# Patient Record
Sex: Female | Born: 1935 | Race: White | Hispanic: No | Smoking: Never smoker
Health system: Southern US, Community
[De-identification: ages and names within clinical notes are randomized; demographics above are authoritative.]

## PROBLEM LIST (undated history)

## (undated) DIAGNOSIS — I272 Pulmonary hypertension, unspecified: Secondary | ICD-10-CM

## (undated) DIAGNOSIS — J309 Allergic rhinitis, unspecified: Secondary | ICD-10-CM

## (undated) DIAGNOSIS — H9190 Unspecified hearing loss, unspecified ear: Secondary | ICD-10-CM

## (undated) DIAGNOSIS — I340 Nonrheumatic mitral (valve) insufficiency: Secondary | ICD-10-CM

## (undated) DIAGNOSIS — C4491 Basal cell carcinoma of skin, unspecified: Secondary | ICD-10-CM

## (undated) DIAGNOSIS — E559 Vitamin D deficiency, unspecified: Secondary | ICD-10-CM

## (undated) DIAGNOSIS — L409 Psoriasis, unspecified: Secondary | ICD-10-CM

## (undated) DIAGNOSIS — I519 Heart disease, unspecified: Secondary | ICD-10-CM

## (undated) DIAGNOSIS — R519 Headache, unspecified: Secondary | ICD-10-CM

## (undated) DIAGNOSIS — E785 Hyperlipidemia, unspecified: Secondary | ICD-10-CM

## (undated) DIAGNOSIS — R51 Headache: Secondary | ICD-10-CM

## (undated) DIAGNOSIS — M810 Age-related osteoporosis without current pathological fracture: Secondary | ICD-10-CM

## (undated) DIAGNOSIS — E039 Hypothyroidism, unspecified: Secondary | ICD-10-CM

## (undated) DIAGNOSIS — I441 Atrioventricular block, second degree: Secondary | ICD-10-CM

## (undated) DIAGNOSIS — G8929 Other chronic pain: Secondary | ICD-10-CM

## (undated) DIAGNOSIS — H3500 Unspecified background retinopathy: Secondary | ICD-10-CM

## (undated) DIAGNOSIS — M1990 Unspecified osteoarthritis, unspecified site: Secondary | ICD-10-CM

## (undated) DIAGNOSIS — I351 Nonrheumatic aortic (valve) insufficiency: Secondary | ICD-10-CM

## (undated) DIAGNOSIS — I10 Essential (primary) hypertension: Secondary | ICD-10-CM

## (undated) DIAGNOSIS — K219 Gastro-esophageal reflux disease without esophagitis: Secondary | ICD-10-CM

## (undated) DIAGNOSIS — I5189 Other ill-defined heart diseases: Secondary | ICD-10-CM

## (undated) HISTORY — DX: Pulmonary hypertension, unspecified: I27.20

## (undated) HISTORY — PX: PACEMAKER PLACEMENT: SHX43

## (undated) HISTORY — DX: Other chronic pain: G89.29

## (undated) HISTORY — DX: Atrioventricular block, second degree: I44.1

## (undated) HISTORY — DX: Unspecified osteoarthritis, unspecified site: M19.90

## (undated) HISTORY — DX: Allergic rhinitis, unspecified: J30.9

## (undated) HISTORY — DX: Basal cell carcinoma of skin, unspecified: C44.91

## (undated) HISTORY — DX: Hypercalcemia: E83.52

## (undated) HISTORY — DX: Headache, unspecified: R51.9

## (undated) HISTORY — PX: JOINT REPLACEMENT: SHX530

## (undated) HISTORY — DX: Nonrheumatic aortic (valve) insufficiency: I35.1

## (undated) HISTORY — DX: Hypothyroidism, unspecified: E03.9

## (undated) HISTORY — DX: Other ill-defined heart diseases: I51.89

## (undated) HISTORY — DX: Psoriasis, unspecified: L40.9

## (undated) HISTORY — DX: Hyperlipidemia, unspecified: E78.5

## (undated) HISTORY — DX: Nonrheumatic mitral (valve) insufficiency: I34.0

## (undated) HISTORY — DX: Vitamin D deficiency, unspecified: E55.9

## (undated) HISTORY — DX: Headache: R51

## (undated) HISTORY — DX: Gastro-esophageal reflux disease without esophagitis: K21.9

## (undated) HISTORY — DX: Unspecified background retinopathy: H35.00

---

## 2000-02-14 ENCOUNTER — Encounter: Admission: RE | Admit: 2000-02-14 | Discharge: 2000-02-14 | Payer: Self-pay | Admitting: Family Medicine

## 2000-02-14 ENCOUNTER — Encounter: Payer: Self-pay | Admitting: Family Medicine

## 2000-06-20 ENCOUNTER — Encounter: Admission: RE | Admit: 2000-06-20 | Discharge: 2000-06-20 | Payer: Self-pay | Admitting: Obstetrics and Gynecology

## 2000-06-20 ENCOUNTER — Encounter: Payer: Self-pay | Admitting: Obstetrics and Gynecology

## 2000-06-26 ENCOUNTER — Encounter: Admission: RE | Admit: 2000-06-26 | Discharge: 2000-06-26 | Payer: Self-pay | Admitting: Obstetrics and Gynecology

## 2000-06-26 ENCOUNTER — Encounter: Payer: Self-pay | Admitting: Obstetrics and Gynecology

## 2001-11-29 ENCOUNTER — Encounter: Admission: RE | Admit: 2001-11-29 | Discharge: 2001-11-29 | Payer: Self-pay | Admitting: Obstetrics and Gynecology

## 2001-11-29 ENCOUNTER — Encounter: Payer: Self-pay | Admitting: Obstetrics and Gynecology

## 2001-12-09 ENCOUNTER — Encounter: Admission: RE | Admit: 2001-12-09 | Discharge: 2001-12-09 | Payer: Self-pay | Admitting: *Deleted

## 2001-12-09 ENCOUNTER — Encounter: Payer: Self-pay | Admitting: *Deleted

## 2001-12-09 ENCOUNTER — Encounter (INDEPENDENT_AMBULATORY_CARE_PROVIDER_SITE_OTHER): Payer: Self-pay | Admitting: Specialist

## 2003-01-29 ENCOUNTER — Encounter: Admission: RE | Admit: 2003-01-29 | Discharge: 2003-01-29 | Payer: Self-pay | Admitting: Obstetrics and Gynecology

## 2003-01-29 ENCOUNTER — Encounter: Payer: Self-pay | Admitting: Obstetrics and Gynecology

## 2004-06-10 ENCOUNTER — Encounter: Admission: RE | Admit: 2004-06-10 | Discharge: 2004-06-10 | Payer: Self-pay | Admitting: Family Medicine

## 2005-01-19 ENCOUNTER — Encounter: Admission: RE | Admit: 2005-01-19 | Discharge: 2005-01-19 | Payer: Self-pay | Admitting: Family Medicine

## 2005-09-21 ENCOUNTER — Encounter: Admission: RE | Admit: 2005-09-21 | Discharge: 2005-09-21 | Payer: Self-pay | Admitting: Family Medicine

## 2006-02-23 ENCOUNTER — Encounter: Admission: RE | Admit: 2006-02-23 | Discharge: 2006-02-23 | Payer: Self-pay | Admitting: Obstetrics and Gynecology

## 2006-05-24 ENCOUNTER — Encounter: Admission: RE | Admit: 2006-05-24 | Discharge: 2006-06-06 | Payer: Self-pay | Admitting: Orthopedic Surgery

## 2006-05-31 ENCOUNTER — Inpatient Hospital Stay (HOSPITAL_COMMUNITY): Admission: RE | Admit: 2006-05-31 | Discharge: 2006-06-06 | Payer: Self-pay | Admitting: Orthopedic Surgery

## 2006-06-05 ENCOUNTER — Encounter: Payer: Self-pay | Admitting: Vascular Surgery

## 2006-06-13 ENCOUNTER — Encounter: Admission: RE | Admit: 2006-06-13 | Discharge: 2006-06-13 | Payer: Self-pay | Admitting: Orthopedic Surgery

## 2007-02-25 ENCOUNTER — Encounter: Admission: RE | Admit: 2007-02-25 | Discharge: 2007-02-25 | Payer: Self-pay | Admitting: Family Medicine

## 2007-04-29 ENCOUNTER — Encounter: Admission: RE | Admit: 2007-04-29 | Discharge: 2007-04-29 | Payer: Self-pay | Admitting: Family Medicine

## 2007-06-03 ENCOUNTER — Encounter: Admission: RE | Admit: 2007-06-03 | Discharge: 2007-06-03 | Payer: Self-pay | Admitting: Gastroenterology

## 2007-06-08 ENCOUNTER — Encounter: Admission: RE | Admit: 2007-06-08 | Discharge: 2007-06-08 | Payer: Self-pay | Admitting: Orthopedic Surgery

## 2008-02-28 ENCOUNTER — Encounter: Admission: RE | Admit: 2008-02-28 | Discharge: 2008-02-28 | Payer: Self-pay | Admitting: Family Medicine

## 2008-03-03 ENCOUNTER — Encounter: Admission: RE | Admit: 2008-03-03 | Discharge: 2008-03-03 | Payer: Self-pay | Admitting: Cardiology

## 2008-03-05 ENCOUNTER — Ambulatory Visit (HOSPITAL_COMMUNITY): Admission: RE | Admit: 2008-03-05 | Discharge: 2008-03-06 | Payer: Self-pay | Admitting: Cardiology

## 2008-03-23 ENCOUNTER — Encounter: Admission: RE | Admit: 2008-03-23 | Discharge: 2008-03-23 | Payer: Self-pay | Admitting: Family Medicine

## 2008-04-09 ENCOUNTER — Emergency Department (HOSPITAL_COMMUNITY): Admission: EM | Admit: 2008-04-09 | Discharge: 2008-04-09 | Payer: Self-pay | Admitting: Emergency Medicine

## 2008-04-15 ENCOUNTER — Encounter: Admission: RE | Admit: 2008-04-15 | Discharge: 2008-04-15 | Payer: Self-pay | Admitting: Family Medicine

## 2008-05-05 ENCOUNTER — Encounter: Admission: RE | Admit: 2008-05-05 | Discharge: 2008-05-05 | Payer: Self-pay | Admitting: Family Medicine

## 2008-10-07 ENCOUNTER — Encounter: Admission: RE | Admit: 2008-10-07 | Discharge: 2008-10-07 | Payer: Self-pay | Admitting: Family Medicine

## 2009-03-01 ENCOUNTER — Encounter: Admission: RE | Admit: 2009-03-01 | Discharge: 2009-03-01 | Payer: Self-pay | Admitting: Family Medicine

## 2009-06-14 IMAGING — CR DG CHEST 2V
2 series · 2 of 2 positions shown · non-contrast
Comparison: 05/24/2006

CLINICAL DATA: Dyspnea.  Chest pain.  Preop for pacer placement.

CHEST - 2 VIEW

[w chest pa]
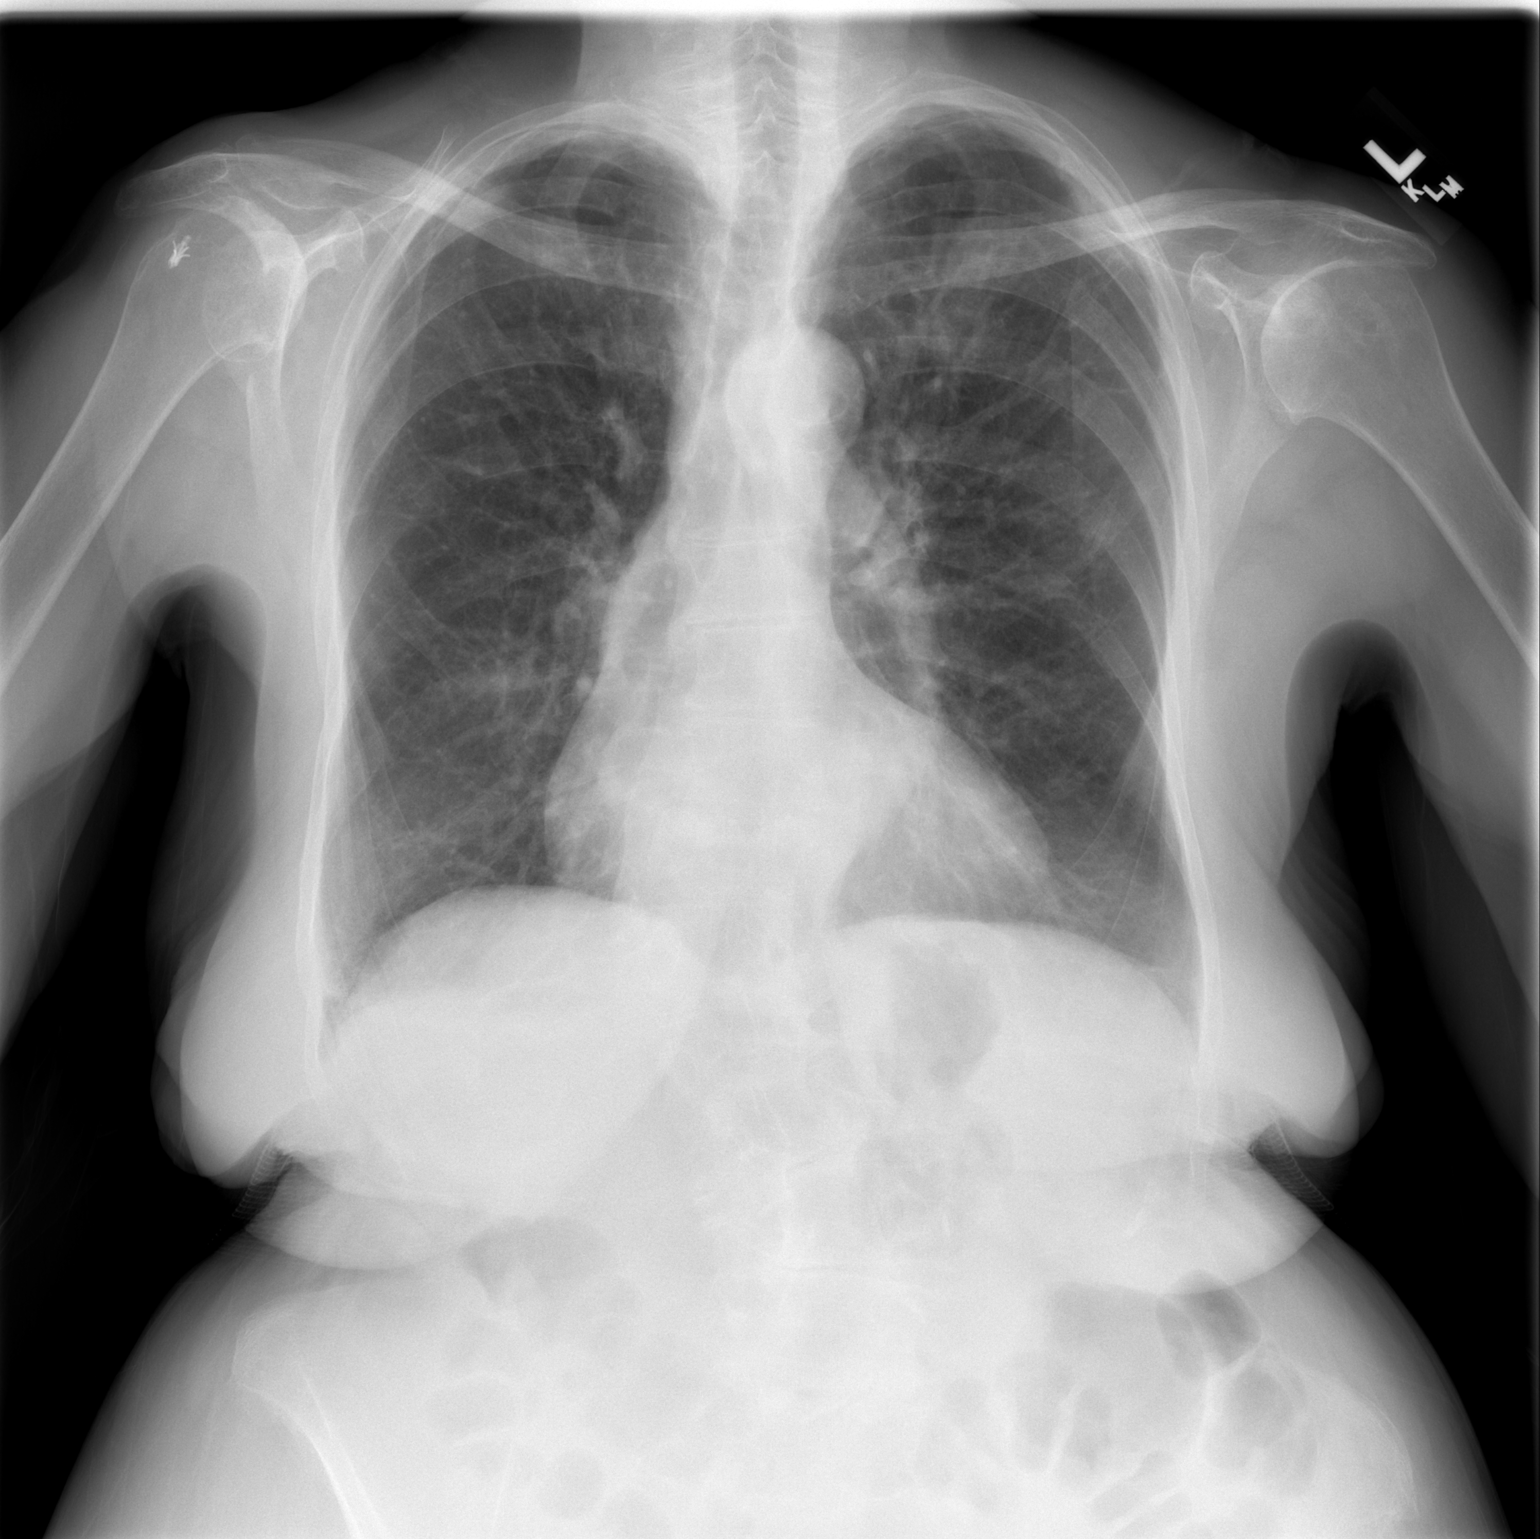

[w chest lat]
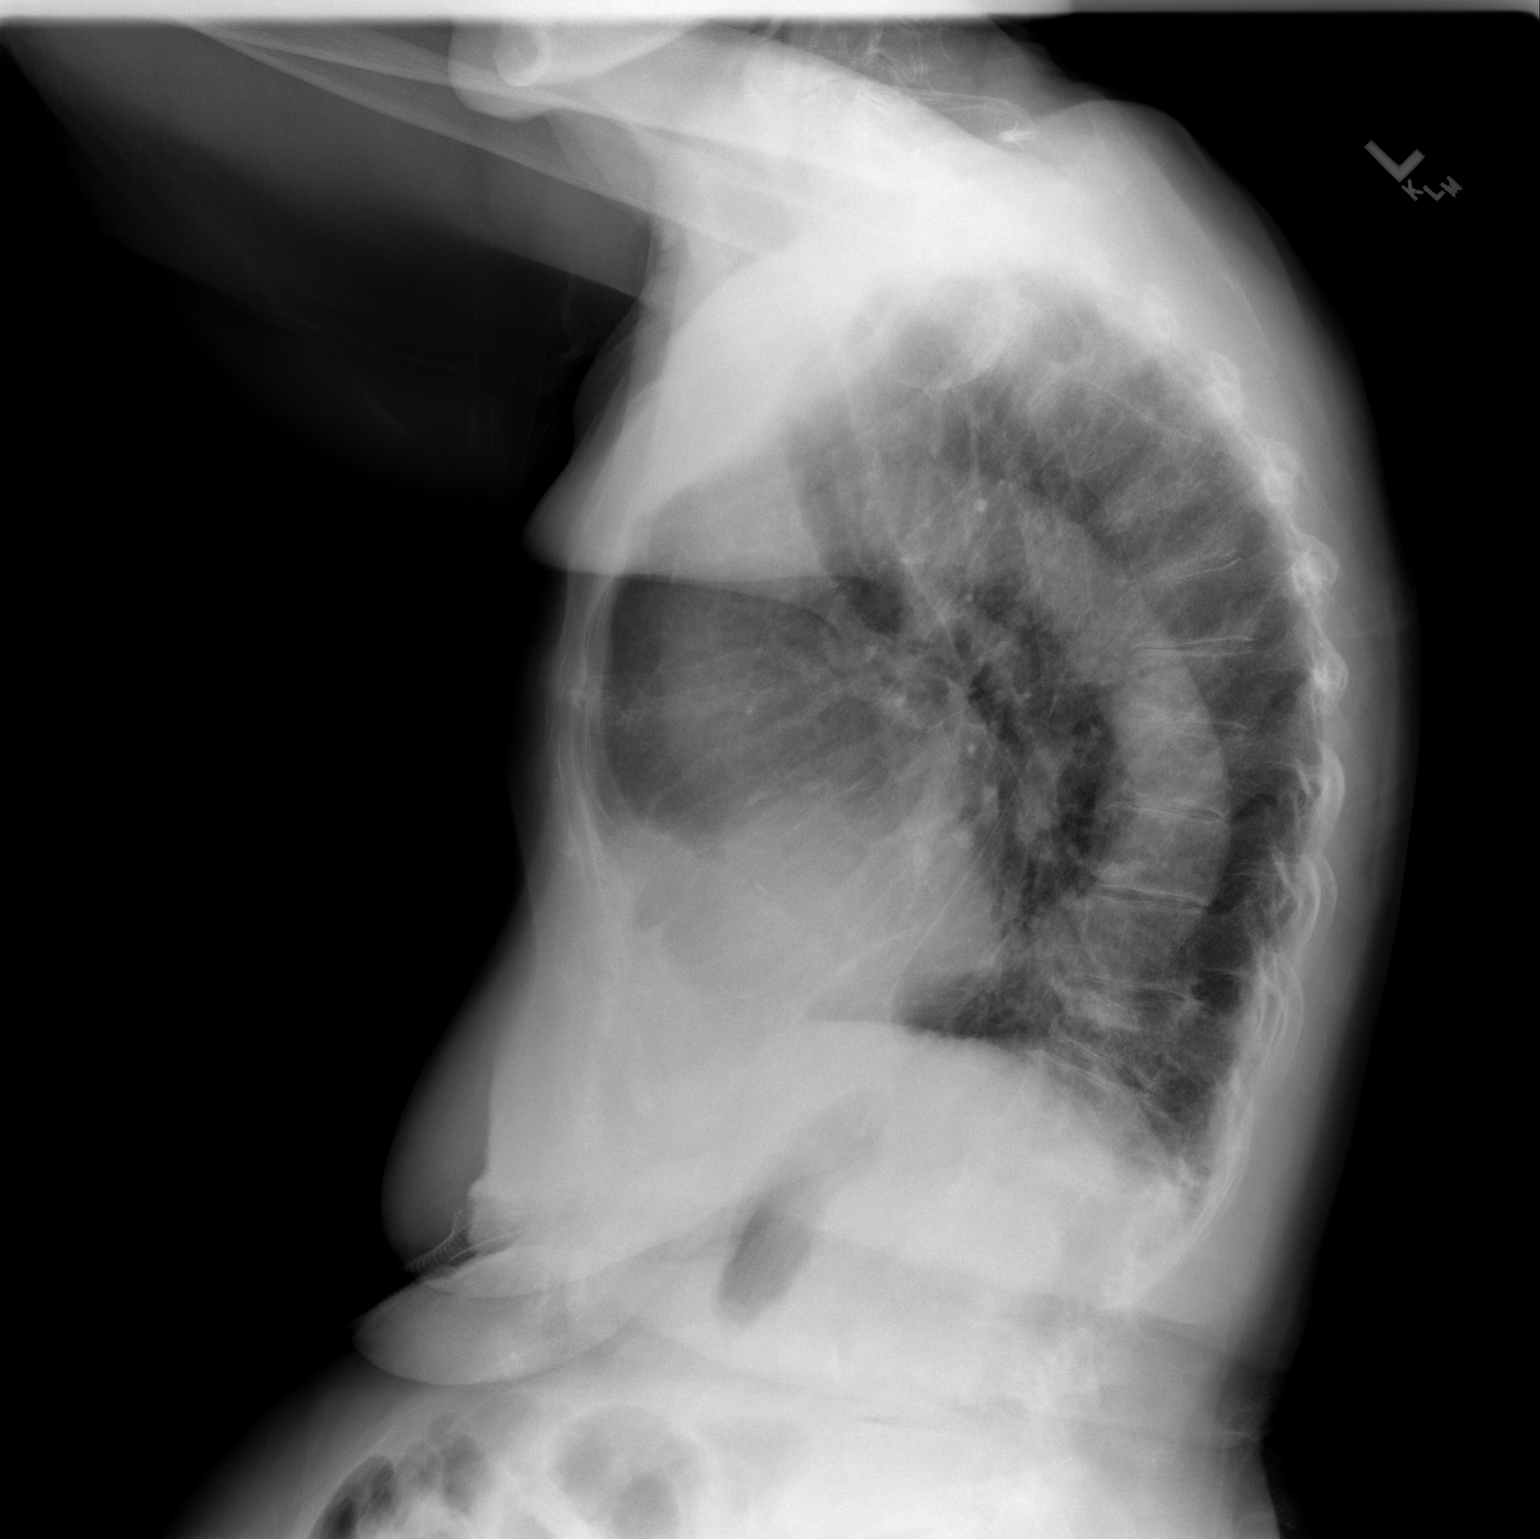

[2 of 2 positions shown; findings below may reference images not displayed]

FINDINGS: Moderate osteopenia.  Suspect hyperinflation.  A mild
lower thoracic compression deformity without significant canal
compromise.  This is of indeterminate acuity as this area is poorly
evaluated on the prior plain film.  Right rotator cuff repair.
Midline trachea. Normal heart size.  Tortuous descending thoracic
aorta. No pleural effusion or pneumothorax. Biapical pleural
thickening.  The right lung is clear.  A vague area of increased
density projects over the left clavicle.
IMPRESSION: 1.  No definite acute cardiopulmonary disease.
2.  Vague increased density projecting over the left clavicle.
This may represent a summation shadow.  If the patient has
infectious symptoms, recommend antibiotic therapy and follow-up
plain films in 3 - 4 weeks.  If there are no infectious symptoms,
consider further evaluation with follow-up plain films or chest CT.

## 2009-06-17 IMAGING — CR DG CHEST 2V
2 series · 2 of 2 positions shown · non-contrast
Comparison: 03/03/2008.  CT thoracic spine 04/29/2007.

CLINICAL DATA: 78-year-old female status post pacemaker placement.

CHEST - 2 VIEW

[w chest pa]
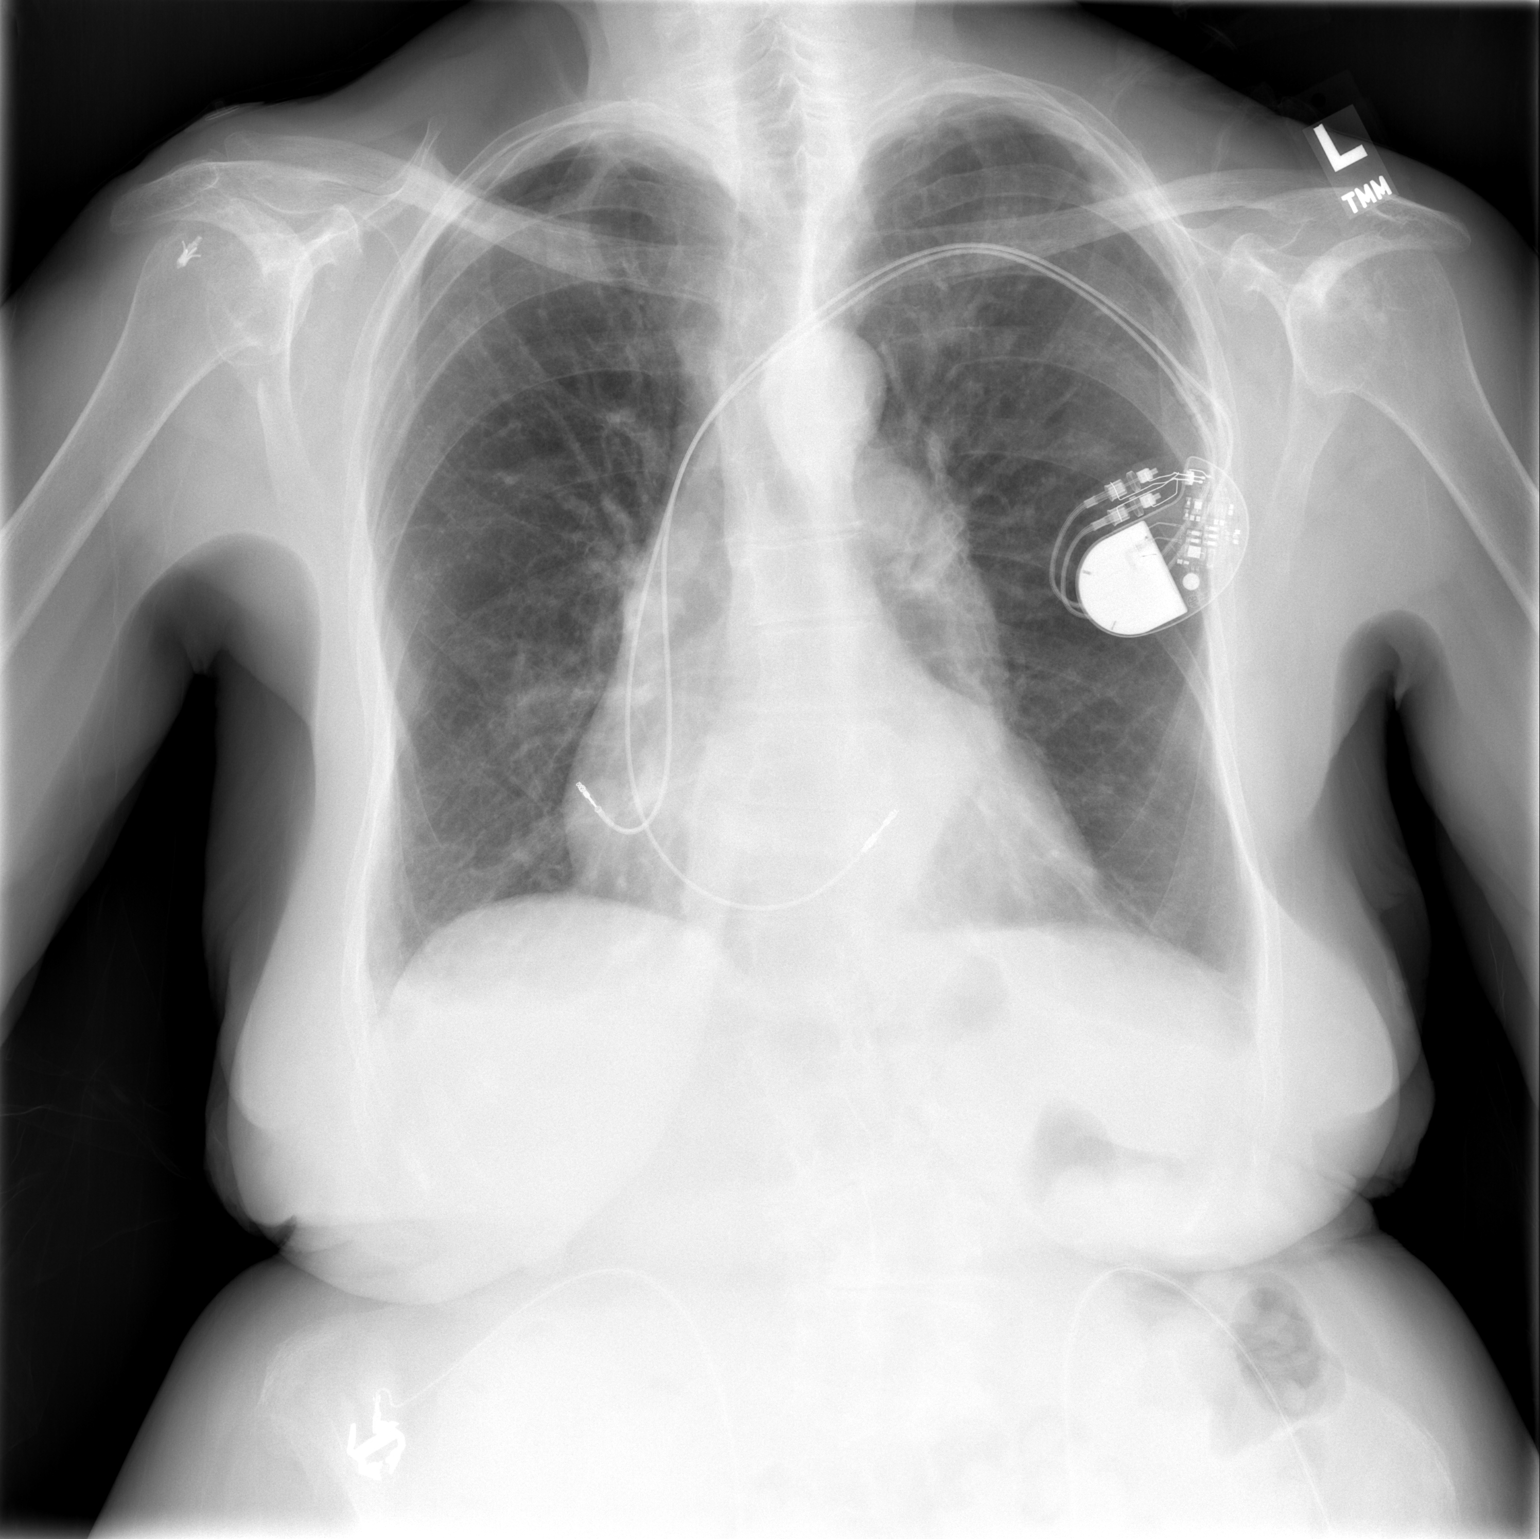

[w chest lat]
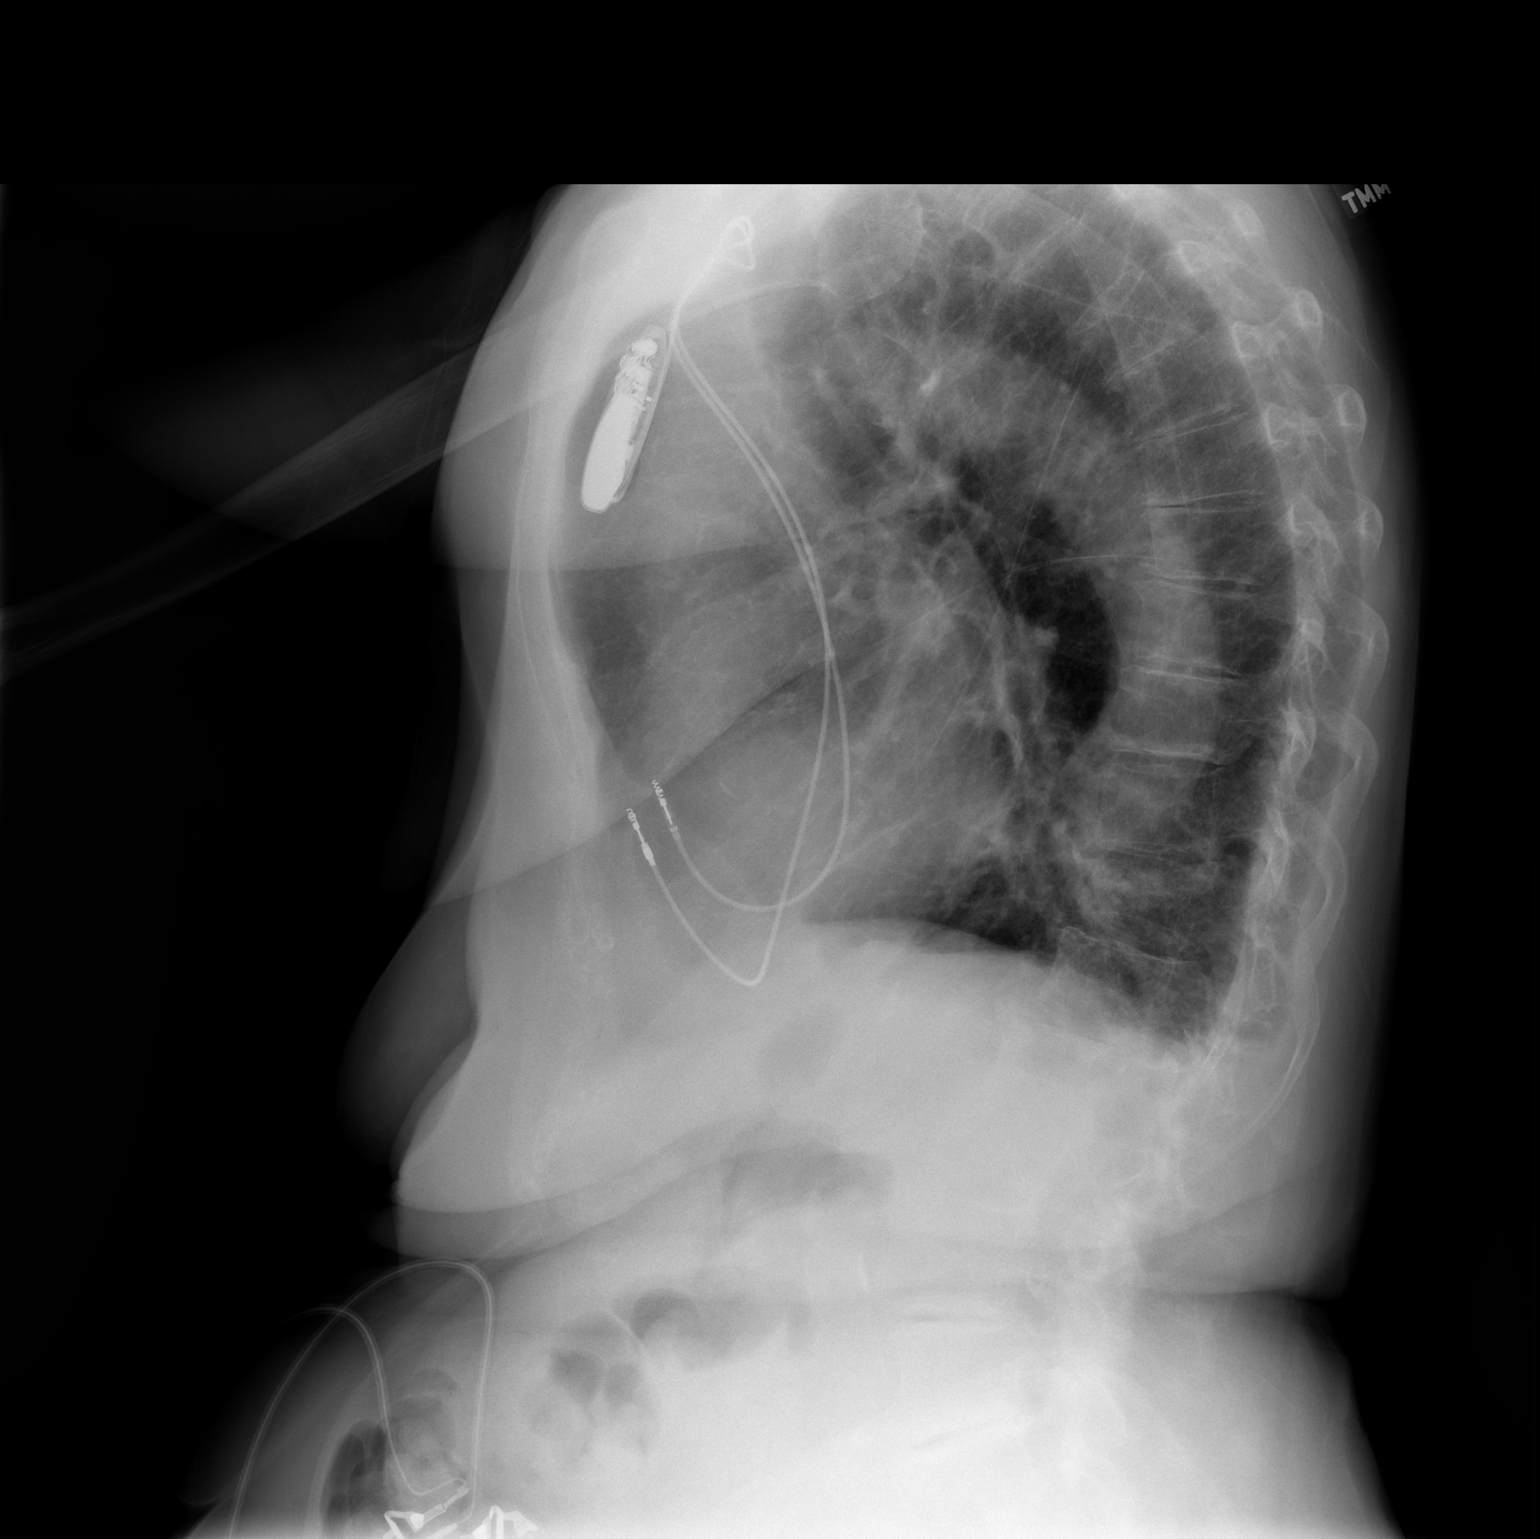

[2 of 2 positions shown; findings below may reference images not displayed]

FINDINGS: New left chest dual lead transvenous pacemaker in place.
Leads course to the right atrium and right ventricle.  No
pneumothorax.  Stable cardiac size mediastinal contour with very
tortuous descending thoracic aorta.  Stable pulmonary ventilation
with biapical scarring.  No pulmonary edema.  Stable lower thoracic
vertebral body compression fracture and visualized osseous
structures.  Increased lung volumes.

The asymmetric density in the left upper lobe partially projecting
over the clavicle described on the prior exam is decreased in
conspicuity.  Correlation with the prior thoracic spine CT revealed
a pulmonary nodule in the superior segment of the left lower lobe.
IMPRESSION: 1.  New left chest dual lead transvenous pacemaker, no adverse
features.
2.  Stable pulmonary ventilation, no pneumothorax.
3.  Superior segment left lower lobe pulmonary nodule.  Follow-up
chest CT is indicated, preferably with contrast if renal function
allows.

## 2009-11-18 ENCOUNTER — Encounter: Admission: RE | Admit: 2009-11-18 | Discharge: 2009-11-18 | Payer: Self-pay | Admitting: Neurosurgery

## 2010-04-11 ENCOUNTER — Encounter: Admission: RE | Admit: 2010-04-11 | Discharge: 2010-04-11 | Payer: Self-pay | Admitting: Family Medicine

## 2010-10-02 ENCOUNTER — Encounter: Payer: Self-pay | Admitting: Family Medicine

## 2011-01-27 NOTE — Op Note (Signed)
NAME:  Anne Ellis, Anne Ellis                  ACCOUNT NO.:  [id number]   MEDICAL RECORD NO.:  [id number]          PATIENT TYPE:  INP   LOCATION:  2899                         FACILITY:  [facility]   PHYSICIAN:  Thereasa Distance A. Mortenson, M.D.DATE OF BIRTH:  June 13, 1930   DATE OF PROCEDURE:  05/31/2006  DATE OF DISCHARGE:                                 OPERATIVE REPORT   PREOPERATIVE DIAGNOSIS:  End-stage osteoarthritis, right knee.   POSTOPERATIVE DIAGNOSIS:  End-stage osteoarthritis, right knee.   OPERATION:  Total knee replacement on the right, all components glued in  using a standard right femoral cemented ACL femoral component with a DePuy  NBT keeled tibial tray, cemented, size 4, with a 10-mm standard-size  polyethylene insert and a metal-back cemented patella, standard size.   SURGEON:  Lenard Galloway. Chaney Malling, M.D.   ASSISTANT:  Arlys John D. Petrarca, P.A.-C.   ANESTHESIA:  General.   DRAINS:  None.   COMPONENTS:  None.   PROCEDURE:  The patient was placed on the operating table in supine position  with a pneumatic tourniquet about the right upper thigh.  The right lower  extremity was prepped with DuraPrep and draped out in the usual manner.  The  leg was then wrapped out with an Esmarch and the tourniquet was elevated.  An incision was made starting above the patella and carried down to the  tibial tubercle.  The skin edges were retracted.  Bleeders were coagulated.  A long medial parapatellar incision was made and the patella was everted  laterally.  The fat pad was excised.  The knee was flexed and both medial  and lateral menisci were excised, as was the origin of the anterior and  posterior cruciate ligaments in the femoral notch area.  At this point, the  computer arrays were inserted in the standard manner using Schanz pin.  Registration was started to find the mechanical axis.  The femoral head  center was done first.  The tibial mechanical axis was then done.  The  proximal tibial  plateau was defined and the distal femoral condyle and  lateral condyles were defined and entered in the computer following computer  prompting.   Tibial planning following the computer prompting was done.  Tibial resection  settings were registered and tibial proximal resection was done following  the computer prompts in the standard manner.  Femoral implant planning was  registered, and then the femoral resection cutting blocks were used  following computer promptings.  The distal femoral resection was used first,  then the anterior femoral resection, and the final femoral resection guide,  and doing chamfer cuts and drilling holes were done.  Once this was all  accomplished, the tibia was subluxed anteriorly using appropriate  retractors.  The proximal tibia was measured and a size 4 tibial tray seemed  to fit nicely.  This was centered on the cut surface and locked in place  with fixation locks.  The smoke stack was inserted and locked in place, and  then a drill hole was placed in the proximal tibia.  The smoke stack was  then removed and the keel winged punch was inserted and made flush with the  tibial tray.  Once this was accomplished, a 10-mm poly trial was selected  and placed on the tibial trial plate.  The standard femoral component was  inserted over the distal end of the femur and all the components were  articulated.  The knee was then put through a full range of motion and there  were full flexion, full extension and excellent alignment to varus and  valgus.  The tissues were balanced beautifully.  Attention was then turned  to the posterior aspect of the patella.  Using the cutting guide, the  posterior aspect of the patella was resected, and then drill holes were  placed in the cut surface of the patella using the drill hole guide.  The  trial poly was then inserted and the knee articulated once again.  The knee  could be put through a full range of motion.  There were  slight  hyperextension, full flexion, excellent stability to varus and valgus  stress.  No spin of the poly.  All soft tissues were balanced beautifully.  The patella would not sublux laterally, even without the capsule closed.  All the components were then removed.  Pulsating lavage was used until all  debris was removed very carefully.  At this point, the glue was mixed, and  each of the components were inserted.  First, the tibial tray was inserted  and glued in in the standard manner.  Glue was placed on the posterior  aspect of the femoral component and the anterior aspect of the resected  distal femur, and the femoral component was driven home.  The trial poly had  been inserted on the tibial tray. The patella had glue placed on the back,  and this was articulated with the cut surface of the patella and held in  place with a patellar clamp.  The knee was held in extension.  Excess glue  was removed in the standard manner before it hardened up.  The glue was  allowed to cure.  The knee was flexed.  The trial poly was removed and an  osteotome was used to remove excess glue.  At this point, the pulsating  lavage was used once again to remove all debris.  The tourniquet was dropped  and bleeders were coagulated.  No arterial bleeders were seen.  The wound  was really quite dry.  The pulsating lavage was used once again and all  debris was meticulously removed.  When this was accomplished to my  satisfaction, the final poly was inserted and cemented in place.  The knee  was put through a full range of motion.  I was extremely pleased with the  final outcome.  The long medial parapatellar incision was closed with  interrupted sutures.  Vicryl was used to close the subcutaneous tissue.  Stainless steel staples were used to close the skin.  Sterile dressings were  applied and the patient returned to the recovery room in excellent condition.  This procedure went extremely well.            ______________________________  Lenard Galloway. Chaney Malling, M.D.     RAM/MEDQ  D:  05/31/2006  T:  05/31/2006  Job:  [id number]

## 2011-01-27 NOTE — Discharge Summary (Signed)
NAME:  Anne Ellis, Anne Ellis                  ACCOUNT NO.:  [id number]   MEDICAL RECORD NO.:  [id number]          PATIENT TYPE:  INP   LOCATION:  5034                         FACILITY:  [facility]   PHYSICIAN:  Rodney A. Mortenson, M.D.DATE OF BIRTH:  03-27-1930   DATE OF ADMISSION:  05/31/2006  DATE OF DISCHARGE:  06/04/2006                                 DISCHARGE SUMMARY   ADMISSION DIAGNOSES:  Osteoarthritis of the right knee.   DISCHARGE DIAGNOSES:  1. Osteoarthritis of the right knee.  2. Hypokalemia.  3. Hyponatremia.  4. Acute blood loss anemia.  5. Hypothyroidism.  6. Hypertension.   PROCEDURE:  Right total knee arthroplasty.   HISTORY:  Anne Ellis is a 77 year old Anne Ellis with right knee pain for  many years.  It has been getting progressively worse.  Anne Ellis has had giving  away as well as waking pain, with intermittent moderate to severe pain in  the knee.  Anne Ellis takes Darvocet for her pain.  Anne Ellis is to the point now where  Anne Ellis is having problems with activities of daily living.  Anne Ellis is now admitted  with radiographic end stage DJD for a total knee arthroplasty.   HOSPITAL COURSE:  A Anne Ellis admitted May 31, 2006  after appropriate laboratory studies were obtained, as well as 1 gram ANCEF  IV on call to the operating room and was taken to the operating room where  Anne Ellis underwent a right total knee arthroplasty by Dr. Chaney Malling and Jacqualine Code PA-C assisting.  Anne Ellis tolerated the procedure well.  Anne Ellis was placed  on a Bayer investigational drug for anti-coagulation.  A Dilaudid PC pump  was used for pain management and in a reduced dose.  Consultations with PT,  OT, and care management were placed.  Ambulation 50% weight bearing on the  right knee.  CPM 0-90 degrees was started for 6-8 hours a day.  Robaxin 500  mg 1 p.o. q.6-8h was ordered for p.r.n. spasms.  Anne Ellis became hyponatremic and  the triamterine/ hydrochlorothiazide was held.  Anne Ellis was placed on a  fluid  restriction of 1200 mL for 24 hours and the hyponatremia had improved.  Anne Ellis  had acute blood loss anemia and was given 1 unit of packed cells on  June 02, 2006 with 10 mg Lasix post transfusion as well as a unit of  packed red cells on June 03, 2006, with 10 mg Lasix post infusion. Anne Ellis  has had resolution of her hemoglobin to over 10 at this time.  Anne Ellis is having  some difficulty with ambulation and is requiring physical and occupational  therapy.  Anne Ellis however has had improvement and is considered for discharge.   LABORATORY DATA:  Admitted with a hemoglobin of 13.1, hematocrit 38.1%,  Wetsel count 4,800, platelets 251,000.  Discharge hemoglobin 10.1, hematocrit  28.9%, Diver count 6,200, platelets 218,000.  Preop pro time 13.2, INR 1.0,  PTT 34.   Preop electrolytes revealed sodium 138, potassium 3.4, chloride 105, CO2 26,  glucose 101, BUN 14, creatinine 0.7, calcium 10.8, total protein 6.7,  albumin 3.9.  AST 37, ALT 25, ALP 102, and total bilirubin 0.7.  Discharge  sodium 134, potassium 3.8, chloride 100, CO2 30, BUN 7, creatinine 0.7, and  glucose was 111.  Urinalysis of September 13 was benign for voided urine.  Anne Ellis was blood type O positive and drug screen negative.  Given 2 units of  packed cells during hospital course.  Urine culture of September 13 revealed  9,000 colonies per mL and insignificant growth.   Radiographic studies:  Chest x-ray of May 25, 2006 revealed no active  cardiopulmonary disease.  Various changes consistent with COPD and emphysema  noted.  EKG of September 13 revealed possible anterior infarct, age  indeterminate.   DISCHARGE INSTRUCTIONS:  Anne Ellis is discharged on a house diet.  No driving or  walking for 6 weeks.  Using her crutches or walker, 3 point gait 50% weight  bearing on the right knee.  CPM 0-90 degrees for 6-8 hours per day.  May  increase this by 5-10 degrees per day as tolerated.  The wound should be  cared for, keeping it  clean and dry and covered with a bandage on a daily  basis.   DISCHARGE MEDICATIONS:  1. Percocet 5/325 1-2 tabs every 4 hours as needed for pain.  2. Robaxin 500 mg 1 p.o. q.6h p.r.n. spasms.  3. Fleets enema or laxative of choice p.r.n.  4. Anne Ellis can resume her Triamterine and hydrochlorothiazide 37.5/25 mg 1      p.o. daily.  Levothyroxine 75 mcg 1 p.o. daily, and calcium 800 mg      daily.   Physical therapy as noted above.   FOLLOW UP:  Anne Ellis needs to return to the office in 2 weeks postop for staple  removal and reevaluation.  Anne Ellis was discharged in improved condition.      Oris Drone Petrarca, P.A.-C.    ______________________________  Lenard Galloway Chaney Malling, M.D.    BDP/MEDQ  D:  06/04/2006  T:  06/04/2006  Job:  [id number]

## 2011-01-27 NOTE — H&P (Signed)
NAME:  Anne Ellis, Anne Ellis                  ACCOUNT NO.:  [id number]   MEDICAL RECORD NO.:  [id number]          PATIENT TYPE:  INP   LOCATION:  NA                           FACILITY:  [facility]   PHYSICIAN:  Rodney A. Mortenson, M.D.DATE OF BIRTH:  1930-09-05   DATE OF ADMISSION:  DATE OF DISCHARGE:                                HISTORY & PHYSICAL   CHIEF COMPLAINT:  Right knee pain.   HISTORY OF PRESENT ILLNESS:  Anne Ellis is a 77 year old [place] female with  right knee pain for 8 years.  Pain has progressed but recently has been  getting progressively worse.  Mechanical __________  were giving way, uses a  cane to ambulate.  Has waking pain.  Pain in the right knee is intermittent,  moderate to severe pain within the right knee.  She takes Darvocet for  severe pain with some relief.  The patient had a concern over a possible  fall secondary to right knee pain.  X-rays of the right knee showed bone on  bone in the mediolateral compartment with large patellofemoral spurs.   ALLERGIES:  THE PATIENT HAS INTOLERANCE TO CODEINE AND MORPHINE, BOTH CAUSE  __________  .  TETRACYCLINE CAUSES A RASH.   PRESENT MEDICATIONS:  1. Levothyroxine 75 mcg 1 tab daily.  2. Triamterine/HCTZ 37.5/25 1 tablet daily.  3. Darvocet N-100 1 tab as needed for pain.  4. Advil 200 mg 1 to tabs as needed for pain once or twice a week.  5. Maximum Greens supplement daily.  6. [employer] Women's Ultra Mega 50+ one daily.  7. Vitamin D 300 I.U. daily.  8. Calcium 800 mg daily.  9. Magnesium 100 mg daily.  10.Potassium 99 mg daily.  11.Vitamin C 300 mg daily.  12.Calcium 1,200 mg daily.  13.Magnesium 600 mg daily.  14.Betadine 50 mg daily.  15.Fish oil EPH 600 mg q. daily.  16.DHE 240 mg p.m.  17.Red rice 1000 mg two capsules daily.  18.Bee pollen 500 mg two daily.   PAST SURGICAL HISTORY:  1. Appendectomy in 1969.  2. 1960 removal of cyst on left wrist.  3. 1985 and 1997, three right shoulder surgeries, rotator cuff  repair in      the one on the left.  4. Repair of tympanic membrane, left ear.  5. Polypectomy, vocal cords.  6. Right foot, removal of a spur.  7. Bladder tack x2.  8. D&C.  9. 1964 evacuation of a large hematoma.  10.Cystoscopy 1963 secondary to bladder infection.  11.Removal of cyst from left breast.  12.Removal of a cyst from forehead.  13.Cyst removal, right leg.   Patient denies any __________  complications with the above procedures and  had no blood transfusions.   SOCIAL HISTORY:  The patient denies any tobacco or alcohol use, and she  lives alone, lives in a [place] resident with one step to the usual entry.  However, she does have an alternate entrance with a ramp.   FAMILY HISTORY:  Patient's mother is deceased, age 26, with hypertension,  had lymphoma, non-Hodgkin's.  Father decreased age 17,  questionable MI and  history of diabetes mellitus.  She has one sister who is living, age 15,  with hypercholesterolemia.   REVIEW OF SYSTEMS:  Positive for glasses she wears at all times, no acute  changes in her vision.  She has wringing in both ears with decreased hearing  in both ears, denies any chest pain, respiratory illness or diabetes  mellitus.  She does have hypertension which is well controlled, has  hypothyroidism, history of vertigo but nothing acute.  History of kidney  infections and bladder infections some 40 years ago.  Otherwise, review of  systems negative, noncontributory.   PHYSICAL EXAMINATION:  GENERAL:  Patient is well-developed, well-nourished  female in no acute distress.  She ambulates with an antalgic gait on the  right knees with a cane in left hand.  Mood and affect are appropriate,  talks at ease with the examiner.  CARDIAC:  Regular rate and rhythm, no murmurs, rubs gallops noted.  CHEST:  Lungs are clear to auscultation bilaterally.  No wheezing, rhonchi  or rales noted.  ABDOMEN:  Soft, nontender.  Bowel sounds x4 quadrants.  NECK:   Patient has noticed some left paraspinous tenderness, mid-cervical.  Overall good range of motion of the neck without radicular symptoms.  Carotid are 2+ bilaterally.  No lymphadenopathy.  Trachea is midline.  BREAST, GENITAL, URINARY AND RECTAL:  Exams all deferred.  HEENT:  Head normocephalic and atraumatic without maxillary or frontal sinus  to palpation.  Sclerae is nonicteric.  EOMs are intact, PERRLA.  Conjunctivae is pink.  No distal external ear deformity is noted, TMs pearly  gray bilaterally.  Nose:  Nasal septum midline.  Nasal mucosa pink, moist  without polyps.  Buccal mucosa pink, moist.  Patient had fairly good dental  repair.  Pharynx without erythema or exudate.  Uvula midline.  BACK:  No tenderness with palpation of the thoracic lumbar spine.  NEUROLOGIC:  Patient is alert and oriented x3.  Cranial nerves 2-12 are  grossly intact.  Deep tendon reflexes are 2+ bilaterally.  Patella and  ankles are equal and symmetric.  EXTREMITIES:  Lower extremity strength testing reveals 5/5 strength  throughout.  MUSCULOSKELETAL:  Upper extremities are equal and symmetric in size and  shape.  The patient does have decreased range of motion of the right  shoulder, actively in full flexion and abduction only at 98 degrees  passively, full flexion to 180 degrees.  She does have marked crepitus with  passive range of motion.  Full range of motion of the left shoulder, full  range of motion of both elbows, wrists and hands.  She has good sensation of  fingertips throughout.  Radial pulses are 2+ bilaterally, equal and  symmetric.  Lower extremities:  Hips bilaterally full range of motion  without pain.  She flexes both hips easily at 90 degrees.  Right knee  appears at 114 degrees of flexion, mild varus deformity, positive crepitus  with passive range of motion.  No effusion, no edema.  Calf nontender.  Left knee 0-118 degrees, no tenderness to palpation along mediolateral joint  line, no  effusion, no edema.  Valgus-varus stressing reveals no laxity.  Left calf is nontender.  Dorsal pedal pulses are 2+ bilaterally.   IMPRESSION:  1. Endstage osteoarthritis right knee.  2. Hypothyroidism.  3. Hypertension.  4. Hypercholesterolemia.  5. Questionable history of vertigo.   PLAN:  Patient is to be admitted to [facility] on 09/20, 2007 and  undergo right  total knee arthroplasty.  Prior to surgery, patient did  receive clearance from her primary care physician, Dr. Donia Guiles of  [facility], [place], [place].  Phone number is [phone].      Richardean Canal, P.A.    ______________________________  Lenard Galloway Chaney Malling, M.D.    GC/MEDQ  D:  05/24/2006  T:  05/24/2006  Job:  [id number]   cc:   Thereasa Distance A. Chaney Malling, M.D.

## 2011-04-17 ENCOUNTER — Other Ambulatory Visit: Payer: Self-pay | Admitting: Family Medicine

## 2011-04-17 DIAGNOSIS — Z1231 Encounter for screening mammogram for malignant neoplasm of breast: Secondary | ICD-10-CM

## 2011-04-20 ENCOUNTER — Ambulatory Visit: Payer: Self-pay

## 2011-07-23 IMAGING — US US EXTREM LOW VENOUS*L*
1 series · 14 of 24 positions shown · non-contrast
Comparison: None.

CLINICAL DATA: Right leg pain and swelling.

 LOWER EXTREMITY VENOUS DUPLEX ULTRASOUND
TECHNIQUE: Gray-scale sonography with graded compression, as well
as color Doppler and duplex ultrasound, were performed to evaluate
the deep venous system of the lower extremity from the level of the
common femoral vein through the popliteal and proximal calf veins.
Spectral Doppler was utilized to evaluate flow at rest and with
distal augmentation maneuvers.

[Series 1: us extrem low venous*left* · 14 of 38 slices shown]
[im 1/38]
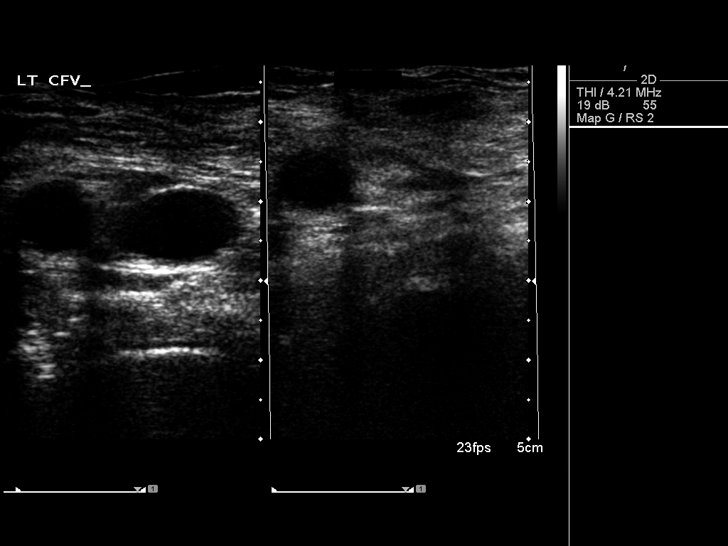
[im 4/38]
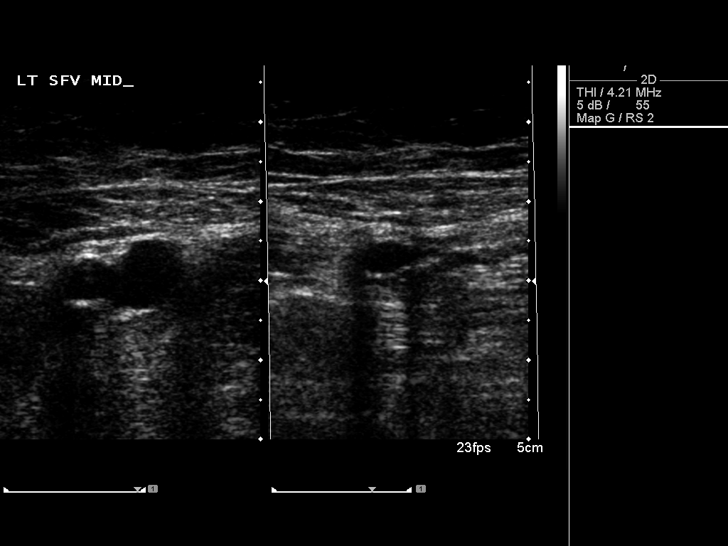
[im 7/38]
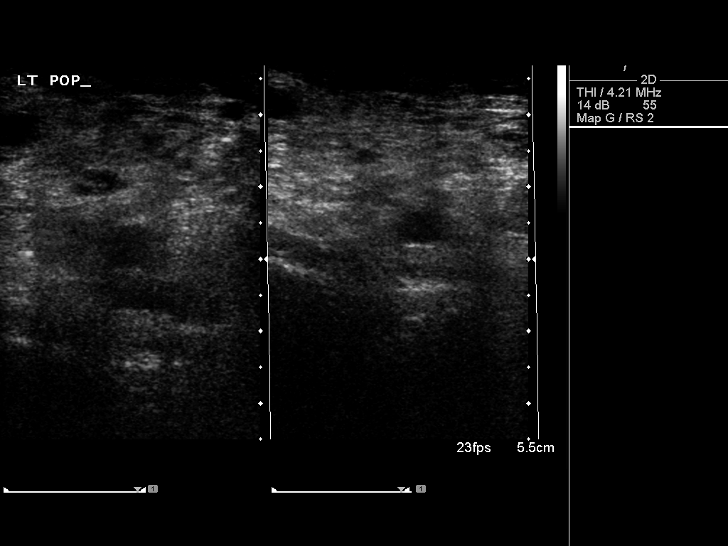
[im 10/38]
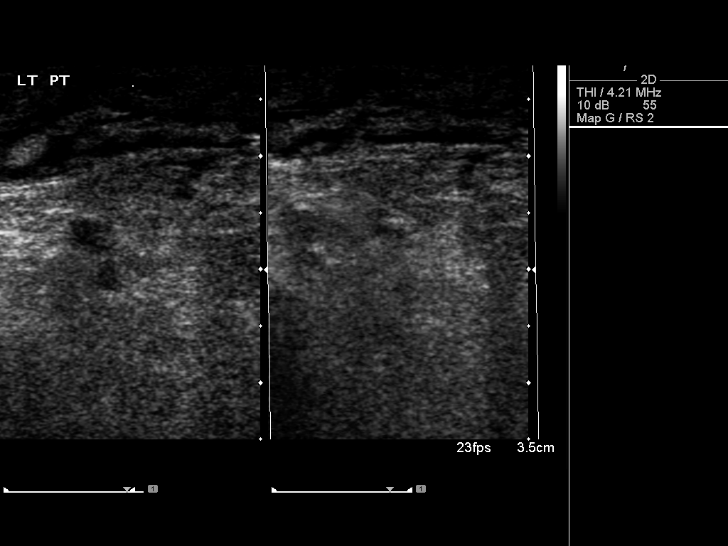
[im 12/38]
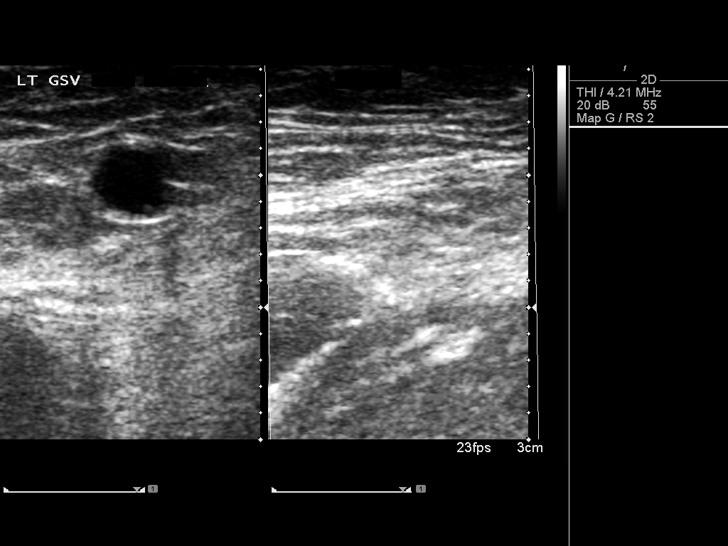
[im 15/38]
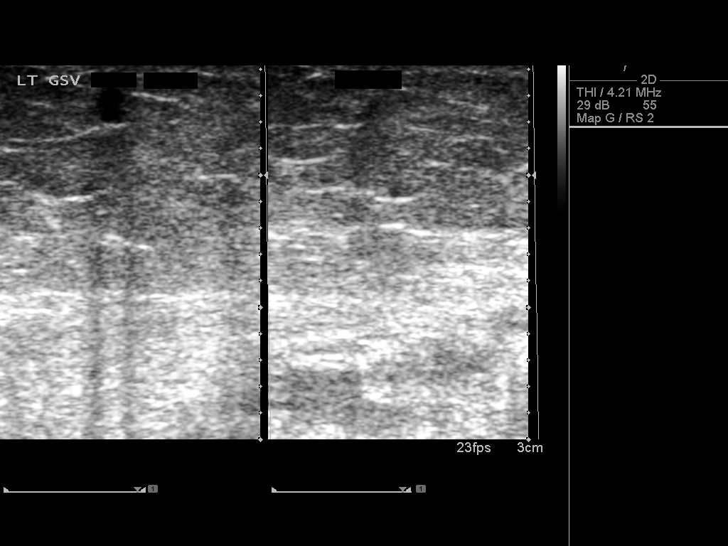
[im 18/38]
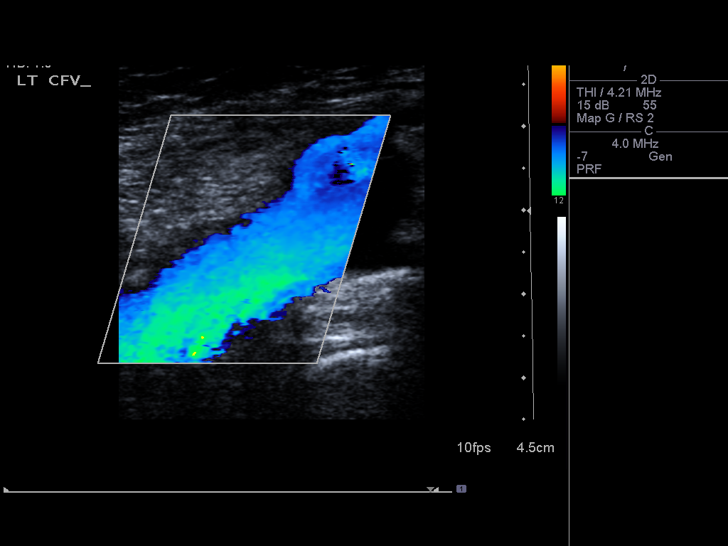
[im 20/38]
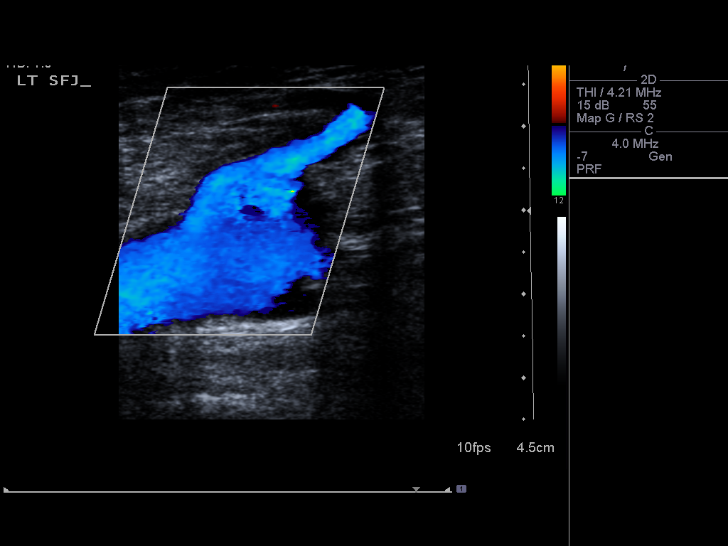
[im 23/38]
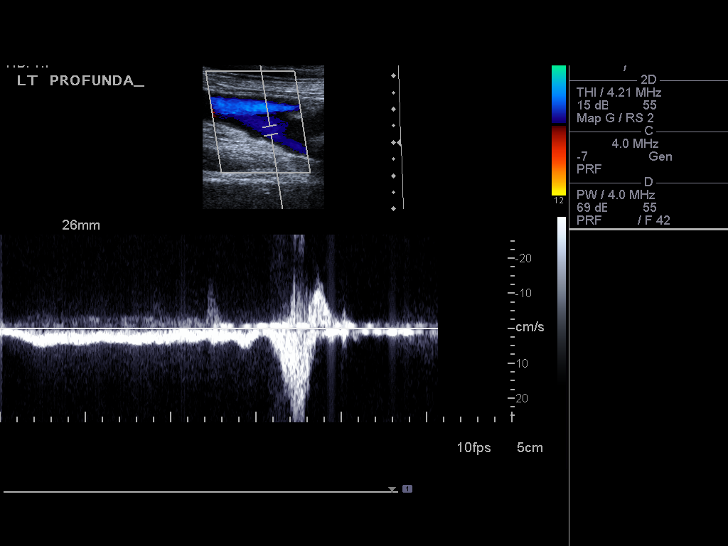
[im 26/38]
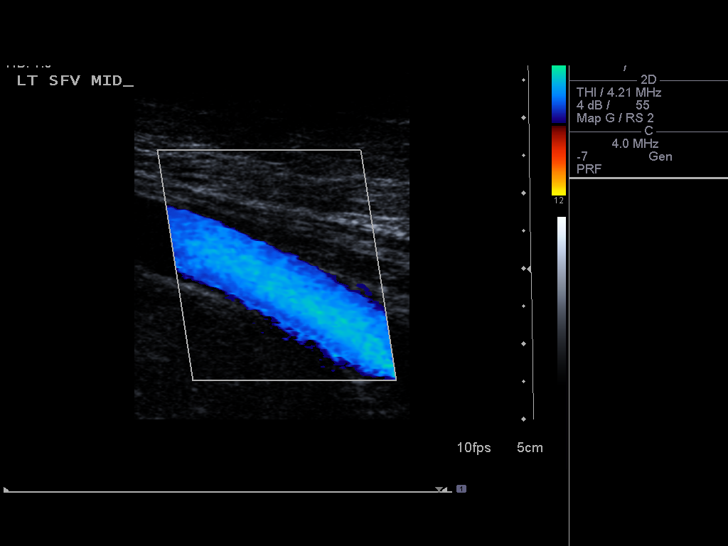
[im 29/38]
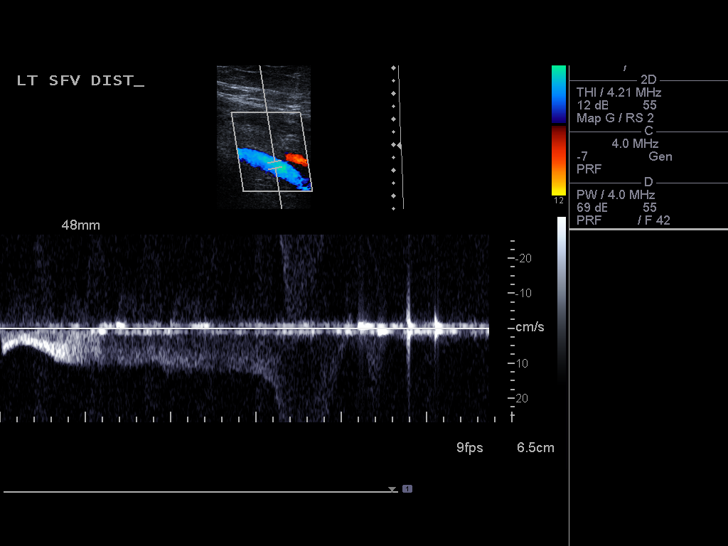
[im 31/38]
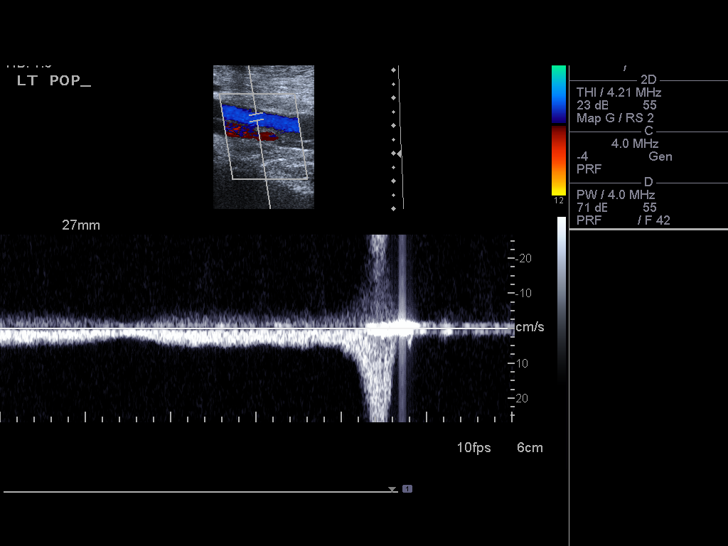
[im 34/38]
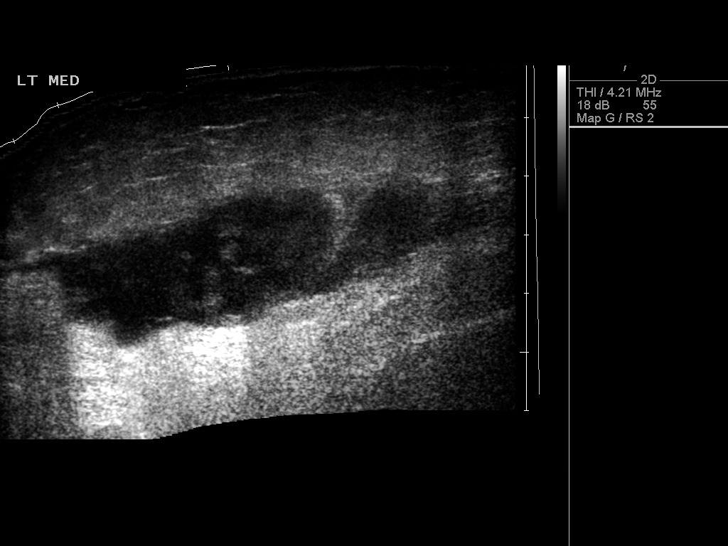
[im 38/38]
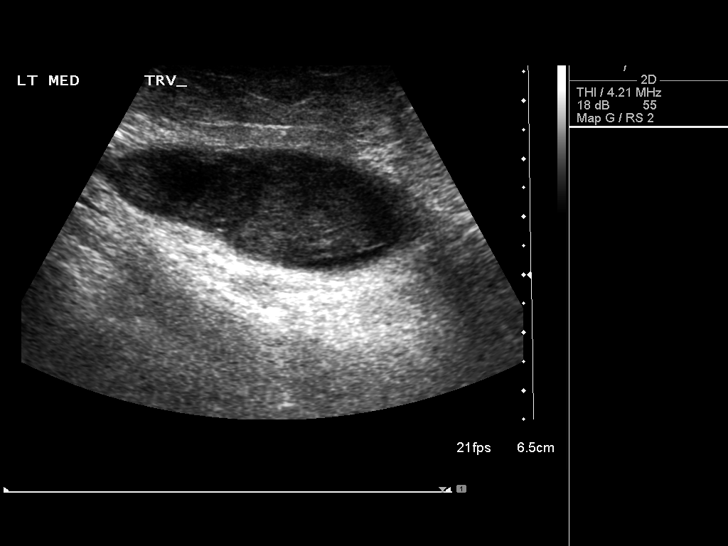

[14 of 24 positions shown; findings below may reference images not displayed]

FINDINGS: There is normal flow, compressibility and augmentation in
the left common femoral, profunda femoral, femoral and popliteal
veins.  No evidence of superficial thrombophlebitis.  A complex
fluid collection in the medial left calf measures 8.7 x 2.3 x
cm.
IMPRESSION: 1.  No evidence of deep venous thrombosis in the left lower
extremity.
2.  Complex fluid collection in the left calf.

## 2011-10-31 ENCOUNTER — Other Ambulatory Visit: Payer: Self-pay | Admitting: Otolaryngology

## 2011-10-31 DIAGNOSIS — M279 Disease of jaws, unspecified: Secondary | ICD-10-CM

## 2011-11-03 ENCOUNTER — Other Ambulatory Visit: Payer: Self-pay

## 2011-11-07 ENCOUNTER — Ambulatory Visit
Admission: RE | Admit: 2011-11-07 | Discharge: 2011-11-07 | Disposition: A | Discharge status: Home / Self Care | Payer: Medicare Other | Source: Ambulatory Visit | Attending: Otolaryngology | Admitting: Otolaryngology

## 2011-11-07 DIAGNOSIS — M279 Disease of jaws, unspecified: Secondary | ICD-10-CM

## 2012-12-17 ENCOUNTER — Encounter (HOSPITAL_COMMUNITY): Payer: Self-pay

## 2012-12-17 ENCOUNTER — Emergency Department (HOSPITAL_COMMUNITY)
Admission: EM | Admit: 2012-12-17 | Discharge: 2012-12-17 | Disposition: A | Discharge status: Home / Self Care | Payer: Medicare PPO | Attending: Emergency Medicine | Admitting: Emergency Medicine

## 2012-12-17 DIAGNOSIS — L02419 Cutaneous abscess of limb, unspecified: Secondary | ICD-10-CM | POA: Insufficient documentation

## 2012-12-17 DIAGNOSIS — I10 Essential (primary) hypertension: Secondary | ICD-10-CM | POA: Insufficient documentation

## 2012-12-17 DIAGNOSIS — H9190 Unspecified hearing loss, unspecified ear: Secondary | ICD-10-CM | POA: Insufficient documentation

## 2012-12-17 DIAGNOSIS — M810 Age-related osteoporosis without current pathological fracture: Secondary | ICD-10-CM | POA: Insufficient documentation

## 2012-12-17 DIAGNOSIS — Z79899 Other long term (current) drug therapy: Secondary | ICD-10-CM | POA: Insufficient documentation

## 2012-12-17 DIAGNOSIS — I519 Heart disease, unspecified: Secondary | ICD-10-CM | POA: Insufficient documentation

## 2012-12-17 DIAGNOSIS — L03115 Cellulitis of right lower limb: Secondary | ICD-10-CM

## 2012-12-17 DIAGNOSIS — L03119 Cellulitis of unspecified part of limb: Secondary | ICD-10-CM | POA: Insufficient documentation

## 2012-12-17 HISTORY — DX: Heart disease, unspecified: I51.9

## 2012-12-17 HISTORY — DX: Essential (primary) hypertension: I10

## 2012-12-17 HISTORY — DX: Age-related osteoporosis without current pathological fracture: M81.0

## 2012-12-17 HISTORY — DX: Unspecified hearing loss, unspecified ear: H91.90

## 2012-12-17 LAB — CBC WITH DIFFERENTIAL/PLATELET
Basophils Absolute: 0.1 10*3/uL (ref 0.0–0.1)
Basophils Relative: 1 % (ref 0–1)
Eosinophils Absolute: 0.2 10*3/uL (ref 0.0–0.7)
Eosinophils Relative: 3 % (ref 0–5)
MCH: 29.9 pg (ref 26.0–34.0)
MCHC: 34.9 g/dL (ref 30.0–36.0)
MCV: 85.7 fL (ref 78.0–100.0)
Neutrophils Relative %: 62 % (ref 43–77)
Platelets: 181 10*3/uL (ref 150–400)
RBC: 4.41 MIL/uL (ref 3.87–5.11)
RDW: 13.3 % (ref 11.5–15.5)

## 2012-12-17 LAB — BASIC METABOLIC PANEL
Calcium: 10.6 mg/dL — ABNORMAL HIGH (ref 8.4–10.5)
GFR calc Af Amer: >90 mL/min (ref >90)
GFR calc non Af Amer: 81 mL/min — ABNORMAL LOW (ref >90)
Glucose, Bld: 89 mg/dL (ref 70–99)
Potassium: 3.8 mEq/L (ref 3.5–5.1)
Sodium: 137 mEq/L (ref 135–145)

## 2012-12-17 MED ORDER — HYDROCODONE-ACETAMINOPHEN 5-325 MG PO TABS
1.0000 | ORAL_TABLET | Freq: Once | ORAL | Status: AC
  Administered 2012-12-17: 1 via ORAL
  Filled 2012-12-17: qty 1

## 2012-12-17 MED ORDER — CLINDAMYCIN HCL 300 MG PO CAPS: 300.0000 mg | ORAL_CAPSULE | Freq: Three times a day (TID) | ORAL | Status: DC

## 2012-12-17 NOTE — ED Notes (Signed)
Patient presents with right lower leg swelling, redness and pain x 2 months.  Has had two doppler studies (March 2014 & April 1) which were both negative for DVT.  Patient is concerned that she may have cellulitis.  No fevers, sweats or chills.

## 2012-12-17 NOTE — ED Provider Notes (Signed)
History     CSN: [id number]  Arrival date & time 12/17/12  0234   First MD Initiated Contact with Patient 12/17/12 [phone]      Chief Complaint  Patient presents with  . Leg Swelling    (Consider location/radiation/quality/duration/timing/severity/associated sxs/prior treatment) HPI Anne Ellis is a 79 y.o. female with a history of right knee surgery (she's had 3 operations on this knee), she also has a history of right lower extremity redness, swelling x2 months. She's had 2 Doppler studies ordered by her primary care physician which were both negative, the most recent 6 days ago.  Patient comes to the ER with her daughter, her daughter wanted her to get checked out again and she didn't know when she beginning to see a physician. She does have pain that is sharp shooting into the right leg, it is localized to the right lower extremity, is been no associated fever or chills, there's been some mild swelling it is been intermittent but does resolve when elevates the leg or sleeps.  The redness has not gotten worse over the last couple months.  Patient has been on Bactrim and amoxicillin.   Past Medical History  Diagnosis Date  . Hypertension   . Osteoporosis   . Hard of hearing   . Heart disease     Past Surgical History  Procedure Laterality Date  . Pacemaker placement    . Joint replacement Right     knee    No family history on file.  History  Substance Use Topics  . Smoking status: Not on file  . Smokeless tobacco: Not on file  . Alcohol Use: Not on file    OB History   Grav Para Term Preterm Abortions TAB SAB Ect Mult Living                  Review of Systems At least 10pt or greater review of systems completed and are negative except where specified in the HPI.  Allergies  Codeine; Lisinopril; Morphine and related; Vitamin d analogs; and Tetracyclines & related  Home Medications   Current Outpatient Rx  Name  Route  Sig  Dispense  Refill  . Coenzyme Q10 (CO  Q 10 PO)   Oral   Take 300 mg by mouth daily.         Marland Kitchen levothyroxine (SYNTHROID, LEVOTHROID) 50 MCG tablet   Oral   Take 50 mcg by mouth daily before breakfast.         . metoprolol succinate (TOPROL-XL) 50 MG 24 hr tablet   Oral   Take 50 mg by mouth daily. Take with or immediately following a meal.         . Multiple Vitamin (MULTIVITAMIN WITH MINERALS) TABS   Oral   Take 1 tablet by mouth daily.         . Omega-3 Fatty Acids (OMEGA 3 PO)   Oral   Take 500 mg by mouth.         . Red Yeast Rice Extract (RED YEAST RICE PO)   Oral   Take 600 mg by mouth 2 (two) times daily.           BP 173/72  Pulse 64  Temp(Src) 97.7 F (36.5 C) (Oral)  Resp 16  SpO2 98%  Physical Exam  Nursing notes reviewed.  Electronic medical record reviewed. VITAL SIGNS:   Filed Vitals:   12/17/12 0240  BP: 173/72  Pulse: 64  Temp: 97.7 F (36.5  C)  TempSrc: Oral  Resp: 16  SpO2: 98%   CONSTITUTIONAL: Awake, oriented, appears non-toxic HENT: Atraumatic, normocephalic, oral mucosa pink and moist, airway patent. Nares patent without drainage. External ears normal. EYES: Conjunctiva clear, EOMI, PERRLA NECK: Trachea midline, non-tender, supple CARDIOVASCULAR: Normal heart rate, Normal rhythm, systolic murmur heard best at the apex PULMONARY/CHEST: Clear to auscultation, no rhonchi, wheezes, or rales. Symmetrical breath sounds. Non-tender. ABDOMINAL: Non-distended, soft, non-tender - no rebound or guarding.  BS normal. NEUROLOGIC: Non-focal, moving all four extremities, no gross sensory or motor deficits. EXTREMITIES: No clubbing, cyanosis. 1+ lower extremity edema, some mild erythema that starts approximately 5 cm below the knee. Patient has a well-healed midline right knee scar, no joint effusion, normal passive and active range of motion. Pulses 2+ bilaterally. No paresthesias or sensory deficits. SKIN: Warm, Dry, No erythema, No rash  ED Course  Procedures (including  critical care time)  Labs Reviewed  BASIC METABOLIC PANEL - Abnormal; Notable for the following:    Calcium 10.6 (*)    GFR calc non Af Amer 81 (*)    All other components within normal limits  CBC WITH DIFFERENTIAL   No results found.   1. Cellulitis of right lower extremity       MDM  Anne Ellis is a 79 y.o. female presents for second opinion of RLE swelling and redness - no acute problem.  Pt has had 2 courses of antibiotics, which may not cover MRSA.  I do not think there is utility in checking her lower extremities for a DVT since it has been done previously with negative results - for that reason I do not see any point in getting a d-dimer, it would likely be positive, and I do not want to start this patient on anticoagulants don't think she's got a clot I believe the risk is much greater than any potential benefit.  I think with her prior workups she likely has some cellulitis of the right lower extremity that is worsened by impaired venous return possibly some lymphatic drainage problems secondary to multiple surgeries on the right knee - she's gotsystemic signs of illness, she is afebrile, no septic physiology, patient has a normal CBC and BMP is unremarkable.  Discussed course of treatment with clindamycin for 5 days with this patient, patient is allergic to doxycycline which will be my first choice, have given her precautionary instructions to return to the emergency department she have any severe diarrhea, abdominal pain, signs of worsening infection including fevers, chills or any other concerning symptoms. Patient will followup with primary care physician in 2-3 days.  I explained the diagnosis and have given explicit precautions to return to the ER including any other new or worsening symptoms as dictated. The patient understands and accepts the medical plan as it's been dictated and I have answered their questions. Discharge instructions concerning home care and prescriptions  have been given.  The patient is STABLE and is discharged to home in good condition.          Jones Skene, MD 12/17/12 [phone]

## 2013-06-30 ENCOUNTER — Encounter: Payer: Self-pay | Admitting: Internal Medicine

## 2013-06-30 DIAGNOSIS — I441 Atrioventricular block, second degree: Secondary | ICD-10-CM

## 2013-08-18 ENCOUNTER — Telehealth: Payer: Self-pay | Admitting: Cardiology

## 2013-08-18 MED ORDER — METOPROLOL SUCCINATE ER 50 MG PO TB24: 50.0000 mg | ORAL_TABLET | Freq: Every day | ORAL | Status: DC

## 2013-08-18 NOTE — Telephone Encounter (Signed)
Please let patient know that constipation is very rare with this med

## 2013-08-18 NOTE — Telephone Encounter (Signed)
How common is this

## 2013-08-18 NOTE — Telephone Encounter (Signed)
Rx sent to pt. To Dr. Mayford Knife does Toprol cause constipation?

## 2013-08-18 NOTE — Telephone Encounter (Signed)
New message     Refill metoprolol 50mg  at [facility]/[place]  [phone]; pt thinks this rx is causing her to be constipated.  Pt has not been seen at new office

## 2013-08-18 NOTE — Telephone Encounter (Signed)
Unable to reach pt. Called both numbers.

## 2013-08-18 NOTE — Telephone Encounter (Signed)
It is rare.  1-2% of patients may experience constipation.

## 2013-08-19 ENCOUNTER — Other Ambulatory Visit: Payer: Self-pay

## 2013-08-19 MED ORDER — METOPROLOL SUCCINATE ER 50 MG PO TB24: 50.0000 mg | ORAL_TABLET | Freq: Every day | ORAL | Status: AC

## 2013-08-20 NOTE — Telephone Encounter (Signed)
Pt is aware via vm  

## 2013-09-09 ENCOUNTER — Encounter: Payer: Self-pay | Admitting: *Deleted

## 2013-09-09 ENCOUNTER — Encounter: Payer: Self-pay | Admitting: Cardiology

## 2013-09-09 DIAGNOSIS — J309 Allergic rhinitis, unspecified: Secondary | ICD-10-CM | POA: Insufficient documentation

## 2013-09-09 DIAGNOSIS — K219 Gastro-esophageal reflux disease without esophagitis: Secondary | ICD-10-CM | POA: Insufficient documentation

## 2013-09-09 DIAGNOSIS — E559 Vitamin D deficiency, unspecified: Secondary | ICD-10-CM | POA: Insufficient documentation

## 2013-09-09 DIAGNOSIS — H9190 Unspecified hearing loss, unspecified ear: Secondary | ICD-10-CM | POA: Insufficient documentation

## 2013-09-09 DIAGNOSIS — I10 Essential (primary) hypertension: Secondary | ICD-10-CM | POA: Insufficient documentation

## 2013-09-09 DIAGNOSIS — M810 Age-related osteoporosis without current pathological fracture: Secondary | ICD-10-CM | POA: Insufficient documentation

## 2013-09-09 DIAGNOSIS — L409 Psoriasis, unspecified: Secondary | ICD-10-CM | POA: Insufficient documentation

## 2013-09-09 DIAGNOSIS — E785 Hyperlipidemia, unspecified: Secondary | ICD-10-CM | POA: Insufficient documentation

## 2013-09-10 ENCOUNTER — Encounter: Payer: Self-pay | Admitting: General Surgery

## 2013-09-10 DIAGNOSIS — I441 Atrioventricular block, second degree: Secondary | ICD-10-CM | POA: Insufficient documentation

## 2013-09-10 DIAGNOSIS — I359 Nonrheumatic aortic valve disorder, unspecified: Secondary | ICD-10-CM

## 2013-09-10 DIAGNOSIS — Z950 Presence of cardiac pacemaker: Secondary | ICD-10-CM | POA: Insufficient documentation

## 2013-09-10 DIAGNOSIS — I447 Left bundle-branch block, unspecified: Secondary | ICD-10-CM

## 2013-09-10 NOTE — Progress Notes (Signed)
Moderate MAC,mild AS/AI, mild to mod MR

## 2013-09-30 ENCOUNTER — Encounter: Payer: Medicare Other | Admitting: Internal Medicine

## 2013-09-30 ENCOUNTER — Ambulatory Visit (INDEPENDENT_AMBULATORY_CARE_PROVIDER_SITE_OTHER): Payer: Medicare PPO | Admitting: *Deleted

## 2013-09-30 ENCOUNTER — Ambulatory Visit (INDEPENDENT_AMBULATORY_CARE_PROVIDER_SITE_OTHER): Payer: Medicare Other | Admitting: Cardiology

## 2013-09-30 ENCOUNTER — Encounter: Payer: Self-pay | Admitting: General Surgery

## 2013-09-30 VITALS — BP 170/80 | HR 110 | Ht 60.5 in | Wt 135.0 lb

## 2013-09-30 DIAGNOSIS — I272 Pulmonary hypertension, unspecified: Secondary | ICD-10-CM

## 2013-09-30 DIAGNOSIS — I10 Essential (primary) hypertension: Secondary | ICD-10-CM

## 2013-09-30 DIAGNOSIS — I359 Nonrheumatic aortic valve disorder, unspecified: Secondary | ICD-10-CM

## 2013-09-30 DIAGNOSIS — Z950 Presence of cardiac pacemaker: Secondary | ICD-10-CM

## 2013-09-30 DIAGNOSIS — I441 Atrioventricular block, second degree: Secondary | ICD-10-CM

## 2013-09-30 DIAGNOSIS — I519 Heart disease, unspecified: Secondary | ICD-10-CM

## 2013-09-30 DIAGNOSIS — I2789 Other specified pulmonary heart diseases: Secondary | ICD-10-CM

## 2013-09-30 DIAGNOSIS — I350 Nonrheumatic aortic (valve) stenosis: Secondary | ICD-10-CM

## 2013-09-30 DIAGNOSIS — I059 Rheumatic mitral valve disease, unspecified: Secondary | ICD-10-CM

## 2013-09-30 DIAGNOSIS — I340 Nonrheumatic mitral (valve) insufficiency: Secondary | ICD-10-CM

## 2013-09-30 DIAGNOSIS — I5189 Other ill-defined heart diseases: Secondary | ICD-10-CM | POA: Insufficient documentation

## 2013-09-30 LAB — MDC_IDC_ENUM_SESS_TYPE_INCLINIC
Battery Impedance: 1100 Ohm
Brady Statistic RA Percent Paced: 4.4 %
Brady Statistic RV Percent Paced: 100 %
Date Time Interrogation Session: 20150120161350
Lead Channel Impedance Value: 326 Ohm
Lead Channel Impedance Value: 437 Ohm
Lead Channel Pacing Threshold Amplitude: 0.5 V
Lead Channel Sensing Intrinsic Amplitude: 1.5 mV
Lead Channel Sensing Intrinsic Amplitude: 2.8 mV
Lead Channel Setting Pacing Amplitude: 2 V
Lead Channel Setting Sensing Sensitivity: 1.5 mV
MDC IDC MSMT BATTERY VOLTAGE: 2.76 V
MDC IDC MSMT LEADCHNL RA PACING THRESHOLD PULSEWIDTH: 0.5 ms
MDC IDC MSMT LEADCHNL RV PACING THRESHOLD AMPLITUDE: 0.75 V
MDC IDC MSMT LEADCHNL RV PACING THRESHOLD PULSEWIDTH: 0.5 ms
MDC IDC PG SERIAL: 1237872
MDC IDC SET LEADCHNL RV PACING PULSEWIDTH: 0.5 ms

## 2013-09-30 NOTE — Progress Notes (Signed)
Pacemaker check in clinic. Normal device function. Thresholds, sensing, impedances consistent with previous measurements. Device programmed to maximize longevity. 4 mode switches, <1%.  No high ventricular rates noted. Device programmed at appropriate safety margins. Histogram distribution appropriate for patient activity level. Device programmed to optimize intrinsic conduction. Estimated longevity 5.25 years. Patient enrolled in remote follow-up/TTM's with [employer]. Plan to follow every 3 months remotely and see annually in office. Patient education completed.  ROV 6 months with Dr. Lovena Le.

## 2013-09-30 NOTE — Progress Notes (Signed)
[place], [place],   [address] Phone: [phone] Fax:  [phone]  Date:  09/30/2013   ID:  Anne Ellis, DOB 11-24-1929, MRN [id number]  PCP:  Gerrit Heck, MD  Cardiologist:  Fransico Him, MD     History of Present Illness:  Anne Ellis is a 80 y.o. female with a history of second degree AV block s/p PPM, HTN and mild AS/AI and mild to moderate MR who presents today for followup.  She is doing well.  She denies any chest pain.  She has chronic DOE when walking or going up steps which she thinks is about the same as the last time I saw her.  She occasionally has some mild LE edema.  She denies any palpitations, dizziness or syncope.     Wt Readings from Last 3 Encounters:  09/30/13 135 lb (61.236 kg)  03/28/13 138 lb 12.8 oz (62.959 kg)     Past Medical History  Diagnosis Date  . Osteoporosis   . Hard of hearing   . Heart disease   . GERD (gastroesophageal reflux disease)   . Hyperlipidemia   . Allergic rhinitis   . Psoriasis   . Hypercalcemia   . Vitamin D deficiency   . Hypertension     has significant WCS  . BCC (basal cell carcinoma)     Dr. Raynelle Jan  . Hypothyroid   . Osteoarthritis   . Mobitz type 2 second degree AV block     s/p Pacer Placement Dr Radford Pax and Dr Oletta Lamas  . Chronic headaches     from neck arthritis  . Retinopathy     ocular  . Hypertension   . Aortic stenosis, mild   . Mitral regurgitation     mild to moderate by echo 02/2012  . Pulmonary HTN     RVSP 35-71mmHg echo 02/2012  . Diastolic dysfunction     Current Outpatient Prescriptions  Medication Sig Dispense Refill  . levothyroxine (SYNTHROID, LEVOTHROID) 50 MCG tablet Take 50 mcg by mouth daily before breakfast. Pt stated she on;y takes this medication along with her toprol xl      . metoprolol succinate (TOPROL-XL) 50 MG 24 hr tablet Take 1 tablet (50 mg total) by mouth daily. Take with or immediately following a meal.  90 tablet  3   No current  facility-administered medications for this visit.    Allergies:    Allergies  Allergen Reactions  . Actonel [Risedronate Sodium]   . Codeine Nausea Only  . Fosamax [Alendronate Sodium]   . Lisinopril Cough  . Morphine And Related Nausea Only  . Vitamin D Analogs Swelling  . Tetracyclines & Related Rash    Social History:  The patient  reports that she has never smoked. She does not have any smokeless tobacco history on file. She reports that she does not drink alcohol or use illicit drugs.   Family History:  The patient's family history is not on file.   ROS:  Please see the history of present illness.      All other systems reviewed and negative.   PHYSICAL EXAM: VS:  BP 170/80  Pulse 110  Ht 5' 0.5" (1.537 m)  Wt 135 lb (61.236 kg)  BMI 25.92 kg/m2 Well nourished, well developed, in no acute distress HEENT: normal Neck: no JVD Cardiac:  normal S1, S2; RRR; 2/6 SM at RUSB radiating to LLSB Lungs:  clear to auscultation bilaterally, no wheezing, rhonchi or rales Abd: soft,  nontender, no hepatomegaly Ext: trace edema Skin: warm and dry Neuro:  CNs 2-12 intact, no focal abnormalities noted       ASSESSMENT AND PLAN:  1. Second degree AV block s/p PPM  - she has a pacer check today 2. HTN - BP elevated today but she is was very upset when she got here.  - I have asked her to check her BP daily for a week and call with the results   - continue metoprolol 3. Mild to moderate MR  - recheck 2D echo to make sure MR and AS have not worsened 4. Mild AS/AI 5. Mild pulmonary HTN  Followup with me in 6 months  Signed, Fransico Him, MD 09/30/2013 3:44 PM

## 2013-09-30 NOTE — Patient Instructions (Signed)
Your physician recommends that you continue on your current medications as directed. Please refer to the Current Medication list given to you today.  Your physician has requested that you have an echocardiogram. Echocardiography is a painless test that uses sound waves to create images of your heart. It provides your doctor with information about the size and shape of your heart and how well your heart's chambers and valves are working. This procedure takes approximately one hour. There are no restrictions for this procedure.  Your physician wants you to follow-up in: 6 Months with Dr Turner You will receive a reminder letter in the mail two months in advance. If you don't receive a letter, please call our office to schedule the follow-up appointment.  

## 2013-10-15 ENCOUNTER — Telehealth: Payer: Self-pay | Admitting: Cardiology

## 2013-10-15 NOTE — Telephone Encounter (Signed)
New problem   These are the pt's BP readings: call pt if any questions.  10/06/13     150/92  Pulse 65 10/07/13     147/95  Pulse 61 10/08/13     No reading 10/09/13     141/88  Pulse 67 10/10/13     158/82   Pulse 60 10/11/13      No reading 10/12/13        128/74   Pulse 73 10/13/13        118/79   Pulse 61 10/14/13       130/84   Pulse 68 10/15/13        145/88   Pulse 63       126/87   Pulse 62

## 2013-10-15 NOTE — Telephone Encounter (Signed)
TO Dr Radford Pax to advise.

## 2013-10-16 NOTE — Telephone Encounter (Signed)
No change in meds at this time.  °

## 2013-10-17 NOTE — Telephone Encounter (Signed)
Went onto [facility] and see Dr Brigitte Pulse added pt HCTZ 25 MG on today pts meds as of 10/08/13. These past BP reflects her being on the HCTZ  To Dr Radford Pax to make aware.

## 2013-10-17 NOTE — Telephone Encounter (Signed)
Since her PCP ordered HCTZ please have patient call PCP with results of BP

## 2013-10-17 NOTE — Telephone Encounter (Signed)
Pt is aware.  

## 2013-10-17 NOTE — Telephone Encounter (Signed)
Dr Brigitte Pulse added HCTZ  To pt and theses were the readings with that med added.

## 2013-10-21 ENCOUNTER — Other Ambulatory Visit (HOSPITAL_COMMUNITY): Payer: Medicare PPO

## 2013-11-11 ENCOUNTER — Ambulatory Visit (HOSPITAL_COMMUNITY): Payer: Medicare PPO | Attending: Cardiology | Admitting: Radiology

## 2013-11-11 ENCOUNTER — Encounter: Payer: Self-pay | Admitting: Cardiology

## 2013-11-11 DIAGNOSIS — I059 Rheumatic mitral valve disease, unspecified: Secondary | ICD-10-CM | POA: Insufficient documentation

## 2013-11-11 DIAGNOSIS — R0602 Shortness of breath: Secondary | ICD-10-CM | POA: Insufficient documentation

## 2013-11-11 DIAGNOSIS — I340 Nonrheumatic mitral (valve) insufficiency: Secondary | ICD-10-CM

## 2013-11-11 DIAGNOSIS — I519 Heart disease, unspecified: Secondary | ICD-10-CM

## 2013-11-11 NOTE — Progress Notes (Signed)
Echocardiogram Performed. 

## 2013-11-21 ENCOUNTER — Encounter: Payer: Self-pay | Admitting: General Surgery

## 2014-02-03 ENCOUNTER — Encounter: Payer: Self-pay | Admitting: Internal Medicine

## 2014-02-03 DIAGNOSIS — I441 Atrioventricular block, second degree: Secondary | ICD-10-CM

## 2014-02-19 ENCOUNTER — Telehealth: Payer: Self-pay | Admitting: Cardiology

## 2014-02-19 NOTE — Telephone Encounter (Signed)
New Message:  Pt states she will be moving to the state of [place] soon. She is requesting to be checked out by Dr. Radford Pax before she leaves. Pt did not want to wait until Aug/Sep for Dr. Theodosia Blender next available. She states she will be in a different state by then.... Pt is requesting to be worked in.Marland Kitchen

## 2014-02-23 ENCOUNTER — Telehealth: Payer: Self-pay | Admitting: General Surgery

## 2014-02-23 NOTE — Telephone Encounter (Signed)
Butch Penny is going to find a place to put pt on schedule in July. She will let me know when pt has been scheduled.

## 2014-02-23 NOTE — Telephone Encounter (Signed)
Message copied by Lily Kocher on Mon Feb 23, 2014 10:43 AM ------      Message from: Mack Guise B      Created: Thu Feb 19, 2014  5:39 PM      Regarding: FW: 6 month f/u       Andee Poles can you please look at Turner schedule to see where I can put her.   Thanks.       ----- Message -----         From: Nuala Alpha, LPN         Sent: 9/62/9528   3:24 PM           To: [facility]      Subject: 6 month f/u                                              Could you please schedule and notify this pt with a 6 month f/u appt with Dr Radford Pax in July? Recall in system from last OV. This pt will be moving out of the state in beginning of August.             Thanks,             Karlene Einstein              ------

## 2014-03-03 ENCOUNTER — Ambulatory Visit (INDEPENDENT_AMBULATORY_CARE_PROVIDER_SITE_OTHER): Payer: Medicare PPO | Admitting: Cardiology

## 2014-03-03 ENCOUNTER — Encounter: Payer: Self-pay | Admitting: Cardiology

## 2014-03-03 VITALS — BP 150/80 | HR 70 | Ht 60.5 in | Wt 134.0 lb

## 2014-03-03 DIAGNOSIS — R600 Localized edema: Secondary | ICD-10-CM

## 2014-03-03 DIAGNOSIS — R609 Edema, unspecified: Secondary | ICD-10-CM

## 2014-03-03 DIAGNOSIS — I441 Atrioventricular block, second degree: Secondary | ICD-10-CM

## 2014-03-03 DIAGNOSIS — Z950 Presence of cardiac pacemaker: Secondary | ICD-10-CM

## 2014-03-03 DIAGNOSIS — I059 Rheumatic mitral valve disease, unspecified: Secondary | ICD-10-CM

## 2014-03-03 DIAGNOSIS — I340 Nonrheumatic mitral (valve) insufficiency: Secondary | ICD-10-CM

## 2014-03-03 DIAGNOSIS — I351 Nonrheumatic aortic (valve) insufficiency: Secondary | ICD-10-CM | POA: Insufficient documentation

## 2014-03-03 DIAGNOSIS — I359 Nonrheumatic aortic valve disorder, unspecified: Secondary | ICD-10-CM

## 2014-03-03 DIAGNOSIS — I10 Essential (primary) hypertension: Secondary | ICD-10-CM

## 2014-03-03 NOTE — Progress Notes (Signed)
[place], [place], [place]  [address] Phone: [phone] Fax:  [phone]  Date:  03/03/2014  ID:  Anne Ellis, DOB Jan 24, 1930, MRN [id number]  PCP:  Gerrit Heck, MD  Cardiologist:  Fransico Him, MD     History of Present Illness: Anne Ellis is a 80 y.o. female with a history of second degree AV block s/p PPM, HTN,  mild AI and mild MR who presents today for followup. She is doing well. She denies any chest pain, SOB, DOE, palpitations or syncope. She occasionally has some mild LE edema.   Wt Readings from Last 3 Encounters:  03/03/14 134 lb (60.782 kg)  09/30/13 135 lb (61.236 kg)  03/28/13 138 lb 12.8 oz (62.959 kg)     Past Medical History  Diagnosis Date  . Osteoporosis   . Hard of hearing   . Heart disease   . GERD (gastroesophageal reflux disease)   . Hyperlipidemia   . Allergic rhinitis   . Psoriasis   . Hypercalcemia   . Vitamin D deficiency   . Hypertension     has significant WCS  . BCC (basal cell carcinoma)     Dr. Raynelle Jan  . Hypothyroid   . Osteoarthritis   . Mobitz type 2 second degree AV block     s/p Pacer Placement Dr Radford Pax and Dr Oletta Lamas  . Chronic headaches     from neck arthritis  . Retinopathy     ocular  . Hypertension   . Mitral regurgitation     mild  by echo 2015  . Pulmonary HTN     recent echo 2015 showed no pulmonary HTN  . Diastolic dysfunction   . Mild aortic insufficiency     Current Outpatient Prescriptions  Medication Sig Dispense Refill  . hydrochlorothiazide (HYDRODIURIL) 25 MG tablet Take 25 mg by mouth daily.      Marland Kitchen levothyroxine (SYNTHROID, LEVOTHROID) 50 MCG tablet Take 50 mcg by mouth daily before breakfast. Pt stated she on;y takes this medication along with her toprol xl      . metoprolol succinate (TOPROL-XL) 50 MG 24 hr tablet Take 1 tablet (50 mg total) by mouth daily. Take with or immediately following a meal.  90 tablet  3   No current facility-administered medications for this  visit.    Allergies:    Allergies  Allergen Reactions  . Actonel [Risedronate Sodium]   . Codeine Nausea Only  . Fosamax [Alendronate Sodium]   . Lisinopril Cough  . Morphine And Related Nausea Only  . Vitamin D Analogs Swelling  . Tetracyclines & Related Rash    Social History:  The patient  reports that she has never smoked. She does not have any smokeless tobacco history on file. She reports that she does not drink alcohol or use illicit drugs.   Family History:  The patient's family history includes Cancer in her mother; Diabetes in her father; Heart attack in her father; Heart disease in her father.   ROS:  Please see the history of present illness.      All other systems reviewed and negative.   PHYSICAL EXAM: VS:  BP 150/80  Pulse 70  Ht 5' 0.5" (1.537 m)  Wt 134 lb (60.782 kg)  BMI 25.73 kg/m2 Well nourished, well developed, in no acute distress HEENT: normal Neck: no JVD Cardiac:  normal S1, S2; RRR; no murmur Lungs:  clear to auscultation bilaterally, no wheezing, rhonchi or rales Abd: soft, nontender, no  hepatomegaly Ext: 1+ edema Skin: warm and dry Neuro:  CNs 2-12 intact, no focal abnormalities noted  EKG:  NSR with V paced rhythm      ASSESSMENT AND PLAN:  1. Second degree AV block s/p PPM 2. HTN - BP elevated today but she has [place] coat HTN - continue metoprolol  3. Mild  MR 4. Mild  AI 5. Mild pulmonary HTN - no evidence on most recent echo 6. Mild LE edema - continue HCTZ.  I will give her a prescription for TED hose stockings  Followup with me PRN since she is moving to [place] this week   Signed, Fransico Him, MD 03/03/2014 3:13 PM

## 2014-03-03 NOTE — Patient Instructions (Signed)
Your physician recommends that you continue on your current medications as directed. Please refer to the Current Medication list given to you today.  Your physician recommends that you schedule a follow-up appointment As Needed

## 2014-03-03 NOTE — Telephone Encounter (Signed)
Pt called saying she is now moving to [place] this weekend and has to be seen by Dr Radford Pax before then. We had a opening at 2:45 today so we fit her in. She was not supposed to be seen until Aug

## 2015-07-13 DEATH — deceased
